# Patient Record
Sex: Female | Born: 1960 | Race: Black or African American | Hispanic: No | Smoking: Never smoker
Health system: Southern US, Community
[De-identification: ages and names within clinical notes are randomized; demographics above are authoritative.]

## PROBLEM LIST (undated history)

## (undated) DIAGNOSIS — E785 Hyperlipidemia, unspecified: Secondary | ICD-10-CM

## (undated) DIAGNOSIS — M549 Dorsalgia, unspecified: Secondary | ICD-10-CM

## (undated) DIAGNOSIS — N920 Excessive and frequent menstruation with regular cycle: Secondary | ICD-10-CM

## (undated) DIAGNOSIS — H409 Unspecified glaucoma: Secondary | ICD-10-CM

## (undated) DIAGNOSIS — Z789 Other specified health status: Secondary | ICD-10-CM

## (undated) DIAGNOSIS — E119 Type 2 diabetes mellitus without complications: Secondary | ICD-10-CM

## (undated) HISTORY — DX: Unspecified glaucoma: H40.9

## (undated) HISTORY — DX: Dorsalgia, unspecified: M54.9

## (undated) HISTORY — DX: Hyperlipidemia, unspecified: E78.5

## (undated) HISTORY — PX: MULTIPLE TOOTH EXTRACTIONS: SHX2053

---

## 2006-05-12 ENCOUNTER — Emergency Department (HOSPITAL_COMMUNITY): Admission: EM | Admit: 2006-05-12 | Discharge: 2006-05-12 | Payer: Self-pay | Admitting: Emergency Medicine

## 2006-08-09 ENCOUNTER — Emergency Department (HOSPITAL_COMMUNITY): Admission: EM | Admit: 2006-08-09 | Discharge: 2006-08-09 | Payer: Self-pay | Admitting: Family Medicine

## 2007-09-13 ENCOUNTER — Emergency Department (HOSPITAL_COMMUNITY): Admission: EM | Admit: 2007-09-13 | Discharge: 2007-09-13 | Payer: Self-pay | Admitting: Emergency Medicine

## 2008-02-16 ENCOUNTER — Emergency Department (HOSPITAL_COMMUNITY): Admission: EM | Admit: 2008-02-16 | Discharge: 2008-02-16 | Payer: Self-pay | Admitting: Emergency Medicine

## 2008-02-24 ENCOUNTER — Emergency Department (HOSPITAL_COMMUNITY): Admission: EM | Admit: 2008-02-24 | Discharge: 2008-02-24 | Payer: Self-pay | Admitting: Emergency Medicine

## 2008-02-29 ENCOUNTER — Emergency Department (HOSPITAL_COMMUNITY): Admission: EM | Admit: 2008-02-29 | Discharge: 2008-02-29 | Payer: Self-pay | Admitting: Emergency Medicine

## 2008-03-09 ENCOUNTER — Emergency Department (HOSPITAL_COMMUNITY): Admission: EM | Admit: 2008-03-09 | Discharge: 2008-03-09 | Payer: Self-pay | Admitting: Emergency Medicine

## 2009-10-23 ENCOUNTER — Emergency Department (HOSPITAL_COMMUNITY): Admission: EM | Admit: 2009-10-23 | Discharge: 2009-10-23 | Payer: Self-pay | Admitting: Emergency Medicine

## 2011-04-01 ENCOUNTER — Encounter: Payer: Self-pay | Admitting: Cardiology

## 2011-04-02 ENCOUNTER — Ambulatory Visit: Payer: Self-pay | Admitting: Cardiology

## 2011-07-27 LAB — POCT I-STAT, CHEM 8
BUN: 10
Calcium, Ion: 1.13
Chloride: 105
Creatinine, Ser: 0.8
Glucose, Bld: 99
HCT: 38
Hemoglobin: 12.9
Potassium: 3.9
Sodium: 137
TCO2: 21

## 2011-07-27 LAB — POCT CARDIAC MARKERS
CKMB, poc: 2.2
Myoglobin, poc: 84.5
Operator id: 257131
Troponin i, poc: <0.05

## 2011-07-27 LAB — D-DIMER, QUANTITATIVE: D-Dimer, Quant: 0.26

## 2011-07-27 LAB — CK TOTAL AND CKMB (NOT AT ARMC)
CK, MB: 3.3
Relative Index: 2
Total CK: 162

## 2011-07-27 LAB — TROPONIN I: Troponin I: 0.02

## 2011-08-10 LAB — OCCULT BLOOD X 1 CARD TO LAB, STOOL: Fecal Occult Bld: NEGATIVE

## 2011-12-17 ENCOUNTER — Encounter (HOSPITAL_COMMUNITY): Payer: Self-pay | Admitting: Obstetrics and Gynecology

## 2011-12-17 ENCOUNTER — Inpatient Hospital Stay (HOSPITAL_COMMUNITY)
Admission: AD | Admit: 2011-12-17 | Discharge: 2011-12-17 | Disposition: A | Discharge status: Home / Self Care | Payer: BC Managed Care – PPO | Source: Ambulatory Visit | Attending: Obstetrics & Gynecology | Admitting: Obstetrics & Gynecology

## 2011-12-17 DIAGNOSIS — N920 Excessive and frequent menstruation with regular cycle: Secondary | ICD-10-CM | POA: Diagnosis present

## 2011-12-17 HISTORY — DX: Other specified health status: Z78.9

## 2011-12-17 HISTORY — DX: Excessive and frequent menstruation with regular cycle: N92.0

## 2011-12-17 LAB — CBC
HCT: 34.1 % — ABNORMAL LOW (ref 36.0–46.0)
Hemoglobin: 10.7 g/dL — ABNORMAL LOW (ref 12.0–15.0)
MCH: 28.3 pg (ref 26.0–34.0)
MCHC: 31.4 g/dL (ref 30.0–36.0)
MCV: 90.2 fL (ref 78.0–100.0)
Platelets: 332 10*3/uL (ref 150–400)
RBC: 3.78 MIL/uL — ABNORMAL LOW (ref 3.87–5.11)
RDW: 12.7 % (ref 11.5–15.5)
WBC: 7.8 10*3/uL (ref 4.0–10.5)

## 2011-12-17 LAB — WET PREP, GENITAL
Clue Cells Wet Prep HPF POC: NONE SEEN
Trich, Wet Prep: NONE SEEN
Yeast Wet Prep HPF POC: NONE SEEN

## 2011-12-17 MED ORDER — MEGESTROL ACETATE 40 MG PO TABS: 40.0000 mg | ORAL_TABLET | Freq: Every day | ORAL | Status: AC

## 2011-12-17 NOTE — Progress Notes (Signed)
Pt also reports some dizziness and a headache that has been off and on all day.

## 2011-12-17 NOTE — ED Provider Notes (Signed)
History     Chief Complaint  Patient presents with  . Vaginal Bleeding   HPI  Pt is 51 years old and complains of heavy vaginal bleeding for 2 weeks.  Past Medical History  Diagnosis Date  . Back pain     Past Surgical History  Procedure Date  . Cesarean section     No family history on file.  History  Substance Use Topics  . Smoking status: Never Smoker   . Smokeless tobacco: Not on file  . Alcohol Use: No    Allergies: No Known Allergies  Prescriptions prior to admission  Medication Sig Dispense Refill  . HYDROcodone-acetaminophen (VICODIN) 5-500 MG per tablet Take 1 tablet by mouth every 6 (six) hours as needed.        . Ibuprofen (ADVIL PO) Take by mouth as needed.          ROS Physical Exam   Blood pressure 117/80, pulse 72, temperature 98.4 F (36.9 C), temperature source Oral, resp. rate 18, height 5\' 6"  (1.676 m), weight 173 lb 8 oz (78.699 kg), last menstrual period 12/05/2011.  Physical Exam  MAU Course  Procedures Care turned over to [facility], CNM Assessment and Plan    Roberta Jenkins 12/17/2011, 7:21 PM

## 2011-12-17 NOTE — Progress Notes (Signed)
Pt states, " I have had bleeding for two weeks, and I have been passing quarter size clots. I had cramping yesterday but none today."

## 2011-12-17 NOTE — Progress Notes (Signed)
"  I have been bleeding and passing clots for 2 weeks.  I've bled like this one time 2 yrs ago.  No cramping, but I did have some cramping yesterday."

## 2011-12-17 NOTE — ED Provider Notes (Signed)
Chief Complaint:  Vaginal Bleeding   Roberta Jenkins is  51 y.o. [phone].  Patient's last menstrual period was 12/05/2011..  She presents complaining of Vaginal Bleeding  Reports LMP 12/05/2011 as expected, however lasted x 2 weeks. States heavy bleeding requiring changing a pad q 2 hours with quarter-sized clots. Lighter today but still bleeding. Denies abd pain or dizziness Obstetrical/Gynecological History: OB History    Grav Para Term Preterm Abortions TAB SAB Ect Mult Living   4 3 3  1  1   3       Past Medical History: Past Medical History  Diagnosis Date  . Back pain   . No pertinent past medical history   . Menorrhagia 12/17/2011    Past Surgical History: Past Surgical History  Procedure Date  . Cesarean section     Family History: Family History  Problem Relation Age of Onset  . Hypertension Mother   . Cancer Mother     Social History: History  Substance Use Topics  . Smoking status: Never Smoker   . Smokeless tobacco: Not on file  . Alcohol Use: No    Allergies: No Known Allergies  Prescriptions prior to admission  Medication Sig Dispense Refill  . calcium carbonate (TUMS - DOSED IN MG ELEMENTAL CALCIUM) 500 MG chewable tablet Chew 1 tablet by mouth daily.      . multivitamin (THERAGRAN) per tablet Take 1 tablet by mouth daily.        Review of Systems - Negative except what has been reviewed in the HPI  Physical Exam   Blood pressure 117/80, pulse 72, temperature 98.4 F (36.9 C), temperature source Oral, resp. rate 18, height 5\' 6"  (1.676 m), weight 173 lb 8 oz (78.699 kg), last menstrual period 12/05/2011.  General: General appearance - alert, well appearing, and in no distress, oriented to person, place, and time and overweight Mental status - alert, oriented to person, place, and time, normal mood, behavior, speech, dress, motor activity, and thought processes, affect appropriate to mood Abdomen - soft, nontender, nondistended, no masses or  organomegaly Focused Gynecological Exam: VULVA: normal appearing vulva with no masses, tenderness or lesions, VAGINA: vaginal discharge - moderate menstrual-like bleeding noted in vault, no clots, CERVIX: normal appearing cervix without discharge or lesions, UTERUS: irregular, mobile, non tender, ADNEXA: normal adnexa in size, nontender and no masses  Labs: Recent Results (from the past 24 hour(s))  CBC   Collection Time   12/17/11  7:25 PM      Component Value Range   WBC 7.8  4.0 - 10.5 (K/uL)   RBC 3.78 (*) 3.87 - 5.11 (MIL/uL)   Hemoglobin 10.7 (*) 12.0 - 15.0 (g/dL)   HCT 45.4 (*) 09.8 - 46.0 (%)   MCV 90.2  78.0 - 100.0 (fL)   MCH 28.3  26.0 - 34.0 (pg)   MCHC 31.4  30.0 - 36.0 (g/dL)   RDW 11.9  14.7 - 82.9 (%)   Platelets 332  150 - 400 (K/uL)  WET PREP, GENITAL   Collection Time   12/17/11  9:50 PM      Component Value Range   Yeast Wet Prep HPF POC NONE SEEN  NONE SEEN    Trich, Wet Prep NONE SEEN  NONE SEEN    Clue Cells Wet Prep HPF POC NONE SEEN  NONE SEEN    WBC, Wet Prep HPF POC RARE (*) NONE SEEN      Assessment: Menorrhagia   Plan: Discharge home Rx Megace and  Iron Follow up next week for outpatient pelvic US, clinic staff will call to schedule clinic appt date/time  Codi Kertz E. 12/17/2011,10:18 PM

## 2011-12-17 NOTE — Discharge Instructions (Signed)
Abnormal Uterine Bleeding Abnormal uterine bleeding can have many causes. Some cases are simply treated, while others are more serious. There are several kinds of bleeding that is considered abnormal, including:  Bleeding between periods.   Bleeding after sexual intercourse.   Spotting anytime in the menstrual cycle.   Bleeding heavier or more than normal.   Bleeding after menopause.  CAUSES  There are many causes of abnormal uterine bleeding. It can be present in teenagers, pregnant women, women during their reproductive years, and women who have reached menopause. Your caregiver will look for the more common causes depending on your age, signs, symptoms and your particular circumstance. Most cases are not serious and can be treated. Even the more serious causes, like cancer of the female organs, can be treated adequately if found in the early stages. That is why all types of bleeding should be evaluated and treated as soon as possible. DIAGNOSIS  Diagnosing the cause may take several kinds of tests. Your caregiver may:  Take a complete history of the type of bleeding.   Perform a complete physical exam and Pap smear.   Take an ultrasound on the abdomen showing a picture of the female organs and the pelvis.   Inject dye into the uterus and Fallopian tubes and X-ray them (hysterosalpingogram).   Place fluid in the uterus and do an ultrasound (sonohysterogrqphy).   Take a CT scan to examine the female organs and pelvis.   Take an MRI to examine the female organs and pelvis. There is no X-ray involved with this procedure.   Look inside the uterus with a telescope that has a light at the end (hysteroscopy).   Scrap the inside of the uterus to get tissue to examine (Dilatation and Curettage, D&C).   Look into the pelvis with a telescope that has a light at the end (laparoscopy). This is done through a very small cut (incision) in the abdomen.  TREATMENT  Treatment will depend on the  cause of the abnormal bleeding. It can include:  Doing nothing to allow the problem to take care of itself over time.   Hormone treatment.   Birth control pills.   Treating the medical condition causing the problem.   Laparoscopy.   Major or minor surgery   Destroying the lining of the uterus with electrical currant, laser, freezing or heat (uterine ablation).  HOME CARE INSTRUCTIONS   Follow your caregiver's recommendation on how to treat your problem.   See your caregiver if you missed a menstrual period and think you may be pregnant.   If you are bleeding heavily, count the number of pads/tampons you use and how often you have to change them. Tell this to your caregiver.   Avoid sexual intercourse until the problem is controlled.  SEEK MEDICAL CARE IF:   You have any kind of abnormal bleeding mentioned above.   You feel dizzy at times.   You are 51 years old and have not had a menstrual period yet.  SEEK IMMEDIATE MEDICAL CARE IF:   You pass out.  You are changinMenorrhagia  Menorrhagia is when a menstrual period is heavier or longer than normal. HOME CARE Only take medicine as told by your doctor.  Do not take aspirin 1 week before or during your period. Aspirin can make the bleeding worse.  Lay down for a while if you change your tampon or pad more than once in 2 hours. This may help lessen the bleeding.  Take any iron pills  as told by your doctor. Heavy bleeding may cause you to lack iron in your body.  Eat a healthy diet and foods with iron. These foods include leafy green vegetables, meat, liver, eggs, and whole grain breads and cereals.  Eat foods that are high in vitamin C. These include oranges, orange juice, and grapefruits. Vitamin C can help your body take in more iron.  Do not try to lose weight. Wait until the heavy bleeding has stopped and your iron level is normal.  GET HELP RIGHT AWAY IF: You get a fever.  You have trouble breathing.  You bleed even  more heavily than usual and pass blood clots.  You feel dizzy, weak, or pass out (faint).  You need to change your tampon or pad more than once an hour.  You feel sick to your stomach (nauseous), throw up (vomit), or have watery poop (diarrhea).  You have problems from medicine.  MAKE SURE YOU:  Understand these instructions.  Will watch your condition.  Will get help right away if you are not doing well or get worse.  Document Released: 07/27/2008 Document Revised: 06/30/2011 Document Reviewed: 07/27/2008  [facility] Patient Information 2012 [place], [place].g pads/tampons every 15 to 30 minutes.   You have belly (abdominal) pain.   You have a temperature of 100 F (37.8 C) or higher.   You become sweaty or weak.   You are passing large blood clots from the vagina.   You start to feel sick to your stomach (nauseous) and throw up (vomit).  Document Released: 10/18/2005 Document Revised: 06/30/2011 Document Reviewed: 03/13/2009 [facility] Patient Information 2012 [place], [place].

## 2011-12-18 LAB — GC/CHLAMYDIA PROBE AMP, GENITAL
Chlamydia, DNA Probe: NEGATIVE
GC Probe Amp, Genital: NEGATIVE

## 2011-12-20 ENCOUNTER — Other Ambulatory Visit (HOSPITAL_COMMUNITY): Payer: BC Managed Care – PPO

## 2011-12-20 ENCOUNTER — Ambulatory Visit (HOSPITAL_COMMUNITY)
Admission: RE | Admit: 2011-12-20 | Discharge: 2011-12-20 | Disposition: A | Discharge status: Home / Self Care | Payer: BC Managed Care – PPO | Source: Ambulatory Visit | Attending: Family Medicine | Admitting: Family Medicine

## 2011-12-20 ENCOUNTER — Inpatient Hospital Stay (HOSPITAL_COMMUNITY): Admit: 2011-12-20 | Payer: BC Managed Care – PPO

## 2011-12-20 DIAGNOSIS — N950 Postmenopausal bleeding: Secondary | ICD-10-CM | POA: Insufficient documentation

## 2011-12-20 DIAGNOSIS — N920 Excessive and frequent menstruation with regular cycle: Secondary | ICD-10-CM

## 2011-12-20 DIAGNOSIS — D251 Intramural leiomyoma of uterus: Secondary | ICD-10-CM | POA: Insufficient documentation

## 2011-12-27 NOTE — ED Provider Notes (Signed)
Medical Screening exam and patient care preformed by advanced practice provider.  Agree with the above management.  

## 2011-12-29 ENCOUNTER — Encounter: Payer: Self-pay | Admitting: Obstetrics and Gynecology

## 2011-12-29 ENCOUNTER — Ambulatory Visit (INDEPENDENT_AMBULATORY_CARE_PROVIDER_SITE_OTHER): Payer: BC Managed Care – PPO | Admitting: Obstetrics and Gynecology

## 2011-12-29 VITALS — BP 131/75 | HR 76 | Temp 97.6°F | Resp 20 | Ht 66.0 in | Wt 172.3 lb

## 2011-12-29 DIAGNOSIS — N939 Abnormal uterine and vaginal bleeding, unspecified: Secondary | ICD-10-CM

## 2011-12-29 DIAGNOSIS — N949 Unspecified condition associated with female genital organs and menstrual cycle: Secondary | ICD-10-CM

## 2011-12-29 DIAGNOSIS — N925 Other specified irregular menstruation: Secondary | ICD-10-CM

## 2011-12-29 DIAGNOSIS — Z124 Encounter for screening for malignant neoplasm of cervix: Secondary | ICD-10-CM

## 2011-12-29 DIAGNOSIS — Z01812 Encounter for preprocedural laboratory examination: Secondary | ICD-10-CM

## 2011-12-29 DIAGNOSIS — N938 Other specified abnormal uterine and vaginal bleeding: Secondary | ICD-10-CM

## 2011-12-29 DIAGNOSIS — N926 Irregular menstruation, unspecified: Secondary | ICD-10-CM

## 2011-12-29 LAB — POCT PREGNANCY, URINE: Preg Test, Ur: NEGATIVE

## 2011-12-29 MED ORDER — MISOPROSTOL 200 MCG PO TABS: ORAL_TABLET | ORAL | Status: DC

## 2011-12-29 NOTE — Patient Instructions (Signed)
You need a biopsy to make sure no cancer is causing abnormal bleeding. Will try again after cytotec treatment. Place the tablet into your vagina on the night prior to biopsy (appointment will be made). You may take 1000mg  of tylenol (acteaminophen) prior to this biopsy for pain.   Endometrial Biopsy This is a test in which a tissue sample (a biopsy) is taken from inside the uterus (womb). It is then looked at by a specialist under a microscope to see if the tissue is normal or abnormal. The endometrium is the lining of the uterus. This test helps determine where you are in your menstrual cycle and how hormone levels are affecting the lining of the uterus. Another use for this test is to diagnose endometrial cancer, tuberculosis, polyps, or inflammatory conditions and to evaluate uterine bleeding. PREPARATION FOR TEST No preparation or fasting is necessary. NORMAL FINDINGS No pathologic conditions. Presence of "secretory-type" endometrium 3 to 5 days before to normal menstruation. Ranges for normal findings may vary among different laboratories and hospitals. You should always check with your doctor after having lab work or other tests done to discuss the meaning of your test results and whether your values are considered within normal limits. MEANING OF TEST  Your caregiver will go over the test results with you and discuss the importance and meaning of your results, as well as treatment options and the need for additional tests if necessary. OBTAINING THE TEST RESULTS It is your responsibility to obtain your test results. Ask the lab or department performing the test when and how you will get your results. Document Released: 02/18/2005 Document Revised: 06/30/2011 Document Reviewed: 09/27/2008 [facility] Patient Information 2012 [place], [place].

## 2011-12-29 NOTE — Progress Notes (Signed)
  Subjective:    Patient ID: Roberta Jenkins, female    DOB: 1961-08-13, 51 y.o.   MRN: [id number]  HPI  1. Menorrhagia. Patient has new heavy bleeding and cramping, passing clots. Onset was 3 weeks ago. Seen in MAU and given rx for megace with stopped bleeding after one week. [place] at that time showed stripe 9mm, small subserosal fibroid and 6x5x5 cm intramural fibroid. Patient has never experienced extended time without menstrual bleeding, does not believe she is post-menopausal. She has had irregular cycle length since she was a teen.   Maternal hx of colon cancer. Nonsmoker.   2. Anemia. Hgb was 10.7, taking iron daily now. Denies syncope.  Review of Systems See HPI otherwise negative.     Objective:   Physical Exam  Vitals reviewed. Constitutional: She is oriented to person, place, and time. She appears well-developed and well-nourished. No distress.  HENT:  Head: Normocephalic and atraumatic.  Abdominal: Soft. She exhibits no distension. There is no tenderness. There is no rebound and no guarding.  Genitourinary: Vagina normal.       External os unable to be penetrated with sound/dilator. No tissue abnormalities.   Neurological: She is alert and oriented to person, place, and time.      Assessment & Plan:   Endometrial Biopsy Procedure Note  Pre-operative Diagnosis: menorrhagia/dysfunctional uterine bleeding  Post-operative Diagnosis: same  Indications: abnormal uterine bleeding, thickened endometrial stipe  Procedure Details   Urine pregnancy test was done and result was neg.  The risks (including infection, bleeding, pain, and uterine perforation) and benefits of the procedure were explained to the patient and Written informed consent was obtained.  Antibiotic prophylaxis against endocarditis was not indicated.   The patient was placed in the dorsal lithotomy position.  Bimanual exam showed the uterus to be in the neutral position.  A Graves' speculum inserted in the  vagina, and the cervix prepped with povidone iodine.  Endocervical curettage with a Kevorkian curette was not performed due to inability to pass dilator through external os.  Condition: Stable  Complications: None  Plan:  Patient will follow up for new attempt after cytotec cervical softening night prior.   The patient was advised to call for any fever or for prolonged or severe pain or bleeding. She was advised to use OTC acetaminophen as needed for mild to moderate pain. She was advised to avoid vaginal intercourse for 48 hours or until the bleeding has completely stopped.  Attending Physician Documentation: Leola Brazil was present and participated.

## 2011-12-29 NOTE — Progress Notes (Signed)
UPT - Negative

## 2012-01-20 ENCOUNTER — Other Ambulatory Visit: Payer: BC Managed Care – PPO | Admitting: Advanced Practice Midwife

## 2012-02-01 ENCOUNTER — Inpatient Hospital Stay (HOSPITAL_COMMUNITY)
Admission: AD | Admit: 2012-02-01 | Discharge: 2012-02-02 | Disposition: A | Discharge status: Home / Self Care | Payer: BC Managed Care – PPO | Attending: Family Medicine | Admitting: Family Medicine

## 2012-02-01 DIAGNOSIS — N938 Other specified abnormal uterine and vaginal bleeding: Secondary | ICD-10-CM | POA: Insufficient documentation

## 2012-02-01 DIAGNOSIS — D219 Benign neoplasm of connective and other soft tissue, unspecified: Secondary | ICD-10-CM

## 2012-02-01 DIAGNOSIS — N949 Unspecified condition associated with female genital organs and menstrual cycle: Secondary | ICD-10-CM | POA: Insufficient documentation

## 2012-02-01 DIAGNOSIS — N926 Irregular menstruation, unspecified: Secondary | ICD-10-CM

## 2012-02-01 DIAGNOSIS — D259 Leiomyoma of uterus, unspecified: Secondary | ICD-10-CM | POA: Insufficient documentation

## 2012-02-01 DIAGNOSIS — N939 Abnormal uterine and vaginal bleeding, unspecified: Secondary | ICD-10-CM

## 2012-02-01 DIAGNOSIS — N925 Other specified irregular menstruation: Secondary | ICD-10-CM | POA: Insufficient documentation

## 2012-02-02 ENCOUNTER — Encounter (HOSPITAL_COMMUNITY): Payer: Self-pay | Admitting: *Deleted

## 2012-02-02 LAB — CBC
HCT: 35.9 % — ABNORMAL LOW (ref 36.0–46.0)
Hemoglobin: 11.5 g/dL — ABNORMAL LOW (ref 12.0–15.0)
MCH: 28.8 pg (ref 26.0–34.0)
MCHC: 32 g/dL (ref 30.0–36.0)
MCV: 90 fL (ref 78.0–100.0)
Platelets: 311 10*3/uL (ref 150–400)
RBC: 3.99 MIL/uL (ref 3.87–5.11)
RDW: 12.4 % (ref 11.5–15.5)
WBC: 6.2 10*3/uL (ref 4.0–10.5)

## 2012-02-02 MED ORDER — KETOROLAC TROMETHAMINE 60 MG/2ML IM SOLN
60.0000 mg | Freq: Once | INTRAMUSCULAR | Status: AC
  Administered 2012-02-02: 60 mg via INTRAMUSCULAR
  Filled 2012-02-02: qty 2

## 2012-02-02 MED ORDER — MISOPROSTOL 200 MCG PO TABS: ORAL_TABLET | ORAL | Status: DC

## 2012-02-02 MED ORDER — IBUPROFEN 800 MG PO TABS: 800.0000 mg | ORAL_TABLET | Freq: Three times a day (TID) | ORAL | Status: AC | PRN

## 2012-02-02 NOTE — MAU Provider Note (Signed)
Chart reviewed and agree with management and plan.  

## 2012-02-02 NOTE — MAU Note (Signed)
Pt reports she reports she has been bleeding x 12 days and has been having left side pain tonight. Denies fever, nausea, vomiting. Pt states she was seen here 1 month ago for abnormal bleeding and was given meds and an appointment was scheduled for this upcoming Friday but she did not feel like she could wait. Pt reports she has used 1-2 pads today.

## 2012-02-02 NOTE — MAU Provider Note (Signed)
History     CSN: [id number]  Arrival date and time: 02/01/12 2357   None     Chief Complaint  Patient presents with  . Abdominal Pain   HPI 51 y.o. Roberta Jenkins with vaginal bleeding and left sided pain brought in by EMS. Bleeding x 12 days, using 1-2 pads /day at this time. Pain occurs intermittently with bleeding. Not taking anything for pain. Seen in MAU in February with vaginal bleeding, noted to have endometrial thickness of 9 mm and a 6.5 cm left sided fibroid. Followed up in GYN clinic for EMB, but was not performed d/t stenotic cervical os. Has appointment on Friday of this week to try EMB again with cytotec. States she did not feel she could wait for that appointment.    Past Medical History  Diagnosis Date  . Back pain   . No pertinent past medical history   . Menorrhagia 12/17/2011    Past Surgical History  Procedure Date  . Cesarean section 1981, 1989  . Multiple tooth extractions     Family History  Problem Relation Age of Onset  . Hypertension Mother   . Cancer Mother     colon  . Cancer Father   . Kidney disease Sister   . Hypertension Brother     History  Substance Use Topics  . Smoking status: Never Smoker   . Smokeless tobacco: Not on file  . Alcohol Use: No    Allergies: No Known Allergies  Prescriptions prior to admission  Medication Sig Dispense Refill  . calcium carbonate (TUMS - DOSED IN MG ELEMENTAL CALCIUM) 500 MG chewable tablet Chew 1 tablet by mouth daily.      . misoprostol (CYTOTEC) 200 MCG tablet Take one tablet in vagina night prior to endometrial biopsy.  1 tablet  0  . multivitamin (THERAGRAN) per tablet Take 1 tablet by mouth daily.        Review of Systems  Constitutional: Negative.   Respiratory: Negative.   Cardiovascular: Negative.   Gastrointestinal: Positive for abdominal pain. Negative for nausea, vomiting, diarrhea and constipation.  Genitourinary: Negative for dysuria, urgency, frequency, hematuria and flank pain.     Positive for vaginal bleeding   Musculoskeletal: Negative.   Neurological: Negative.   Psychiatric/Behavioral: Negative.    Physical Exam   Blood pressure 113/77, pulse 68, temperature 98.3 F (36.8 C), resp. rate 18, height 5\' 8"  (1.727 m), weight 180 lb (81.647 kg), last menstrual period 01/23/2012.  Physical Exam  Nursing note and vitals reviewed. Constitutional: She is oriented to person, place, and time. She appears well-developed and well-nourished. No distress.  Cardiovascular: Normal rate.   Respiratory: Effort normal.  GI: Soft. She exhibits no distension and no mass. There is no tenderness. There is no rebound and no guarding.  Genitourinary: There is no tenderness, lesion or injury on the right labia. There is no tenderness, lesion or injury on the left labia. Uterus is enlarged (large fibroid palpable on left ). Uterus is not tender. Cervix exhibits no motion tenderness and no discharge. Right adnexum displays no tenderness and no fullness. Left adnexum displays no tenderness and no fullness. There is bleeding (small) around the vagina.  Musculoskeletal: Normal range of motion.  Neurological: She is alert and oriented to person, place, and time.  Skin: Skin is warm and dry.  Psychiatric: She has a normal mood and affect.    MAU Course  Procedures Results for orders placed during the hospital encounter of 02/01/12 (from the past 24  hour(s))  CBC     Status: Abnormal   Collection Time   02/02/12 12:08 AM      Component Value Range   WBC 6.2  4.0 - 10.5 (K/uL)   RBC 3.99  3.87 - 5.11 (MIL/uL)   Hemoglobin 11.5 (*) 12.0 - 15.0 (g/dL)   HCT 16.1 (*) 09.6 - 46.0 (%)   MCV 90.0  78.0 - 100.0 (fL)   MCH 28.8  26.0 - 34.0 (pg)   MCHC 32.0  30.0 - 36.0 (g/dL)   RDW 04.5  40.9 - 81.1 (%)   Platelets 311  150 - 400 (K/uL)   Hgb 10.7 at last visit in February.   UPT negative.   Pain improved with Toradol 60 mg IM.   Assessment and Plan  51 y.o. Roberta Jenkins with  DUB/Fibroids Rx Motrin 600 mg q6h for pain F/U at scheduled GYN appt on Friday for EMB. Pt appears unaware of her existing cytotec rx that she is supposed to take prior to that appt. Reiterated instructions and sent new rx for Cytotec to her pharmacy. Pt states understanding.  Rev'd precautions  Roberta Jenkins 02/02/2012, 12:21 AM

## 2012-02-04 ENCOUNTER — Other Ambulatory Visit (HOSPITAL_COMMUNITY)
Admission: RE | Admit: 2012-02-04 | Discharge: 2012-02-04 | Disposition: A | Discharge status: Home / Self Care | Payer: BC Managed Care – PPO | Source: Ambulatory Visit | Attending: Obstetrics & Gynecology | Admitting: Obstetrics & Gynecology

## 2012-02-04 ENCOUNTER — Encounter: Payer: Self-pay | Admitting: Obstetrics & Gynecology

## 2012-02-04 ENCOUNTER — Ambulatory Visit (INDEPENDENT_AMBULATORY_CARE_PROVIDER_SITE_OTHER): Payer: BC Managed Care – PPO | Admitting: Obstetrics & Gynecology

## 2012-02-04 VITALS — BP 116/72 | HR 77 | Temp 98.9°F | Ht 66.0 in | Wt 177.4 lb

## 2012-02-04 DIAGNOSIS — Z01812 Encounter for preprocedural laboratory examination: Secondary | ICD-10-CM

## 2012-02-04 DIAGNOSIS — N949 Unspecified condition associated with female genital organs and menstrual cycle: Secondary | ICD-10-CM | POA: Insufficient documentation

## 2012-02-04 DIAGNOSIS — N925 Other specified irregular menstruation: Secondary | ICD-10-CM | POA: Insufficient documentation

## 2012-02-04 DIAGNOSIS — N882 Stricture and stenosis of cervix uteri: Secondary | ICD-10-CM

## 2012-02-04 DIAGNOSIS — N920 Excessive and frequent menstruation with regular cycle: Secondary | ICD-10-CM

## 2012-02-04 DIAGNOSIS — N938 Other specified abnormal uterine and vaginal bleeding: Secondary | ICD-10-CM | POA: Insufficient documentation

## 2012-02-04 LAB — POCT PREGNANCY, URINE: Preg Test, Ur: NEGATIVE

## 2012-02-04 NOTE — Progress Notes (Signed)
  Endometrial Biopsy Procedure Note  Pre-operative Diagnosis: Menorrhagia, failed prior attempt of endometrial biopsy 2/2 cervical stenosis (patient placed cytotec per vagina yesterday)  Post-operative Diagnosis: same  Procedure Details  Urine pregnancy test was done today and result was negative.  The risks (including infection, bleeding, pain, and uterine perforation) and benefits of the procedure were explained to the patient and Written informed consent was obtained. The patient was placed in the dorsal lithotomy position.  Bimanual exam showed the uterus to be in the neutral position.  A Graves' speculum inserted in the vagina, and the cervix prepped with povidone iodine.  A sharp tenaculum was applied to the anterior lip of the cervix for stabilization.  A sterile uterine sound and dilator was used to sound the uterus to a depth of 9cm.  A Mylex 3mm curette was used to sample the endometrium.  Sample was sent for pathologic examination.  Condition: Stable  Complications: None  Plan:  The patient was advised to call for any fever or for prolonged or severe pain or bleeding. She was advised to use OTC acetaminophen and OTC analgesics as needed for mild to moderate pain. She was advised to avoid vaginal intercourse for 48 hours or until the bleeding has completely stopped.  Attending Physician Documentation: I was present for or participated in the entire procedure, including opening and closing.

## 2012-02-04 NOTE — Progress Notes (Signed)
Patient had attempted endometrial biopsy at last visit for dub, patient states took cytotec in preparation for biopsy today.

## 2012-02-04 NOTE — Patient Instructions (Signed)

## 2012-03-10 ENCOUNTER — Ambulatory Visit (INDEPENDENT_AMBULATORY_CARE_PROVIDER_SITE_OTHER): Payer: BC Managed Care – PPO | Admitting: Obstetrics & Gynecology

## 2012-03-10 VITALS — BP 126/76 | HR 75 | Temp 97.4°F | Ht 68.0 in | Wt 180.0 lb

## 2012-03-10 DIAGNOSIS — N920 Excessive and frequent menstruation with regular cycle: Secondary | ICD-10-CM

## 2012-03-10 MED ORDER — MEGESTROL ACETATE 40 MG PO TABS: 40.0000 mg | ORAL_TABLET | Freq: Every day | ORAL | Status: DC

## 2012-03-10 MED ORDER — MEGESTROL ACETATE 40 MG PO TABS: 40.0000 mg | ORAL_TABLET | Freq: Every day | ORAL | Status: AC

## 2012-03-10 NOTE — Progress Notes (Signed)
Patient ID: Roberta Jenkins, female   DOB: [birth year], 51 y.o.   MRN: [id number] Subjective:     Roberta Jenkins is an 52 y.o. woman who presents for follow-up of  irregular menses and endometrial biopsy results.  Since her last office visit she has a period 5/1-5/9, moderate bleeding, few clots, no cramping.   Menstrual History: OB History    Grav Para Term Preterm Abortions TAB SAB Ect Mult Living   4 3 3  1  1   3       Patient's last menstrual period was 03/01/2012.    Review of Systems Pertinent items are noted in HPI.    Objective:    BP 126/76  Pulse 75  Temp(Src) 97.4 F (36.3 C) (Oral)  Ht 5\' 8"  (1.727 m)  Wt 180 lb (81.647 kg)  BMI 27.37 kg/m2  LMP 03/01/2012  General:   alert, cooperative and no distress     Assessment:  Menorrhagia Improving. Endometrium, biopsy abundant benign endocervical mucosa associated chronic inflammation, with no evidence of dysplasia or malignancy. We discussed treatment options for menorrhagia (IUD, OCPs, uterine ablation, continue Megace, watch and wait).  Patient opted to continue megace prn.  Plan:    Continue megace as needed.

## 2012-03-10 NOTE — Assessment & Plan Note (Signed)
A: improving menorrhagia. Endometrial biopsy negative for malignancy. P: Continue megace as needed.

## 2012-03-10 NOTE — Patient Instructions (Signed)
[place],  Thank you very much for coming in today. As we discussed your biopsy results was negative for cancerous cells.   For your bleeding: please continue megace as needed for heavy bleeding.   If the bleeding worsens, keep in mind that there are other options (Mirena IUD, Depo Shot, daily birth control pills and uterine ablation).   Please f/u as needed.   Dr. Armen Pickup

## 2012-12-09 ENCOUNTER — Encounter (HOSPITAL_COMMUNITY): Payer: Self-pay | Admitting: Emergency Medicine

## 2012-12-09 ENCOUNTER — Emergency Department (HOSPITAL_COMMUNITY)
Admission: EM | Admit: 2012-12-09 | Discharge: 2012-12-09 | Disposition: A | Discharge status: Home / Self Care | Payer: BC Managed Care – PPO | Attending: Emergency Medicine | Admitting: Emergency Medicine

## 2012-12-09 DIAGNOSIS — Z8739 Personal history of other diseases of the musculoskeletal system and connective tissue: Secondary | ICD-10-CM | POA: Insufficient documentation

## 2012-12-09 DIAGNOSIS — R062 Wheezing: Secondary | ICD-10-CM | POA: Insufficient documentation

## 2012-12-09 DIAGNOSIS — J3489 Other specified disorders of nose and nasal sinuses: Secondary | ICD-10-CM | POA: Insufficient documentation

## 2012-12-09 DIAGNOSIS — R059 Cough, unspecified: Secondary | ICD-10-CM

## 2012-12-09 DIAGNOSIS — Z8742 Personal history of other diseases of the female genital tract: Secondary | ICD-10-CM | POA: Insufficient documentation

## 2012-12-09 DIAGNOSIS — R05 Cough: Secondary | ICD-10-CM | POA: Insufficient documentation

## 2012-12-09 MED ORDER — AEROCHAMBER Z-STAT PLUS/MEDIUM MISC: 1.0000 | Freq: Once | Status: DC

## 2012-12-09 MED ORDER — HYDROCOD POLST-CHLORPHEN POLST 10-8 MG/5ML PO LQCR: 5.0000 mL | Freq: Two times a day (BID) | ORAL | Status: DC | PRN

## 2012-12-09 MED ORDER — ALBUTEROL SULFATE (5 MG/ML) 0.5% IN NEBU: 5.0000 mg | INHALATION_SOLUTION | Freq: Once | RESPIRATORY_TRACT | Status: DC

## 2012-12-09 NOTE — ED Provider Notes (Signed)
History     CSN: [id number]  Arrival date & time 12/09/12  1118   First MD Initiated Contact with Patient 12/09/12 1151      Chief Complaint  Patient presents with  . Cough  . Nasal Congestion    (Consider location/radiation/quality/duration/timing/severity/associated sxs/prior treatment) HPI Comments: Patient presents with complaint of cough and mild rhinorrhea for the past 7 days. Patient initially had additional symptoms such as sore throat which have improved. She denies fever, nausea, or vomiting. She denies ear pain. She's been using over-the-counter cold medications without relief. She denies having history of asthma or bronchitis. She states that the coughing is worse at night and is associated with wheezing. Onset of symptoms gradual. Course is constant. Nothing makes symptoms better or worse.  The history is provided by the patient.    Past Medical History  Diagnosis Date  . Back pain   . No pertinent past medical history   . Menorrhagia 12/17/2011    Past Surgical History  Procedure Laterality Date  . Cesarean section  1981, 1989  . Multiple tooth extractions      Family History  Problem Relation Age of Onset  . Hypertension Mother   . Cancer Mother     colon  . Cancer Father   . Kidney disease Sister   . Hypertension Brother     History  Substance Use Topics  . Smoking status: Never Smoker   . Smokeless tobacco: Never Used  . Alcohol Use: No    OB History   Grav Para Term Preterm Abortions TAB SAB Ect Mult Living   4 3 3  1  1   3       Review of Systems  Constitutional: Negative for fever, chills and fatigue.  HENT: Positive for rhinorrhea. Negative for ear pain, congestion, sore throat, neck stiffness and sinus pressure.   Eyes: Negative for redness.  Respiratory: Positive for cough and wheezing. Negative for shortness of breath.   Gastrointestinal: Negative for nausea, vomiting, abdominal pain and diarrhea.  Genitourinary: Negative for  dysuria.  Musculoskeletal: Negative for myalgias.  Skin: Negative for rash.  Neurological: Negative for headaches.  Hematological: Negative for adenopathy.  Psychiatric/Behavioral: Positive for sleep disturbance.    Allergies  Review of patient's allergies indicates no known allergies.  Home Medications   Current Outpatient Rx  Name  Route  Sig  Dispense  Refill  . aspirin-sod bicarb-citric acid (ALKA-SELTZER) 325 MG TBEF   Oral   Take 650 mg by mouth every 6 (six) hours as needed (cough).         . Multiple Vitamin (MULTIVITAMIN WITH MINERALS) TABS   Oral   Take 1 tablet by mouth daily.         Marland Kitchen OVER THE COUNTER MEDICATION   Oral   Take 3 tablets by mouth daily. Vitamin for skin, hair, and nails         . chlorpheniramine-HYDROcodone (TUSSIONEX PENNKINETIC ER) 10-8 MG/5ML LQCR   Oral   Take 5 mLs by mouth every 12 (twelve) hours as needed.   115 mL   0     BP 118/84  Pulse 71  Temp(Src) 97.1 F (36.2 C) (Oral)  Ht 5\' 8"  (1.727 m)  Wt 180 lb (81.647 kg)  BMI 27.38 kg/m2  SpO2 100%  Physical Exam  Nursing note and vitals reviewed. Constitutional: She appears well-developed and well-nourished.  HENT:  Head: Normocephalic and atraumatic. No trismus in the jaw.  Right Ear: Tympanic membrane, external ear  and ear canal normal.  Left Ear: Tympanic membrane, external ear and ear canal normal.  Nose: Mucosal edema and rhinorrhea present.  Mouth/Throat: Uvula is midline, oropharynx is clear and moist and mucous membranes are normal. Mucous membranes are not dry. No oral lesions. No edematous. No oropharyngeal exudate, posterior oropharyngeal edema, posterior oropharyngeal erythema or tonsillar abscesses.  Eyes: Conjunctivae are normal. Right eye exhibits no discharge. Left eye exhibits no discharge.  Neck: Normal range of motion. Neck supple.  Cardiovascular: Normal rate, regular rhythm and normal heart sounds.   Pulmonary/Chest: Effort normal and breath sounds  normal. No respiratory distress. She has no wheezes. She has no rales.  Abdominal: Soft. There is no tenderness.  Lymphadenopathy:    She has no cervical adenopathy.  Neurological: She is alert.  Skin: Skin is warm and dry.  Psychiatric: She has a normal mood and affect.    ED Course  Procedures (including critical care time)  Labs Reviewed - No data to display No results found.   1. Cough    12:35 PM Patient seen and examined. Medications ordered.   Vital signs reviewed and are as follows: Filed Vitals:   12/09/12 1125  BP: 118/84  Pulse: 71  Temp: 97.1 F (36.2 C)   Patient counseled on use of albuterol HFA.  Told to use 1-2 puffs q 4 hours as needed for SOB.  Patient counseled on use of narcotic cough medications. Urged not to drink alcohol, drive, or perform any other activities that requires focus while taking these medications. The patient verbalizes understanding and agrees with the plan.     MDM  Patient with likely post-URI cough/bronchitis. No concern for PNA given normal lung exam. Antibiotics not indicated. Conservative therapy indicated.           Renne Crigler, [place] 12/09/12 1311

## 2012-12-09 NOTE — ED Provider Notes (Signed)
Medical screening examination/treatment/procedure(s) were performed by non-physician practitioner and as supervising physician I was immediately available for consultation/collaboration.  Doug Sou, MD 12/09/12 [phone]

## 2012-12-09 NOTE — ED Notes (Signed)
Pt c/o cough and nasal congestion that began 12/02/12.  Pt reports the symptoms are worse.  Denies fever or chills.

## 2014-09-02 ENCOUNTER — Encounter (HOSPITAL_COMMUNITY): Payer: Self-pay | Admitting: Emergency Medicine

## 2014-09-07 ENCOUNTER — Inpatient Hospital Stay (HOSPITAL_COMMUNITY)
Admission: AD | Admit: 2014-09-07 | Discharge: 2014-09-07 | Disposition: A | Discharge status: Home / Self Care | Payer: BC Managed Care – PPO | Source: Ambulatory Visit | Attending: Obstetrics & Gynecology | Admitting: Obstetrics & Gynecology

## 2014-09-07 ENCOUNTER — Encounter (HOSPITAL_COMMUNITY): Payer: Self-pay | Admitting: *Deleted

## 2014-09-07 DIAGNOSIS — N920 Excessive and frequent menstruation with regular cycle: Secondary | ICD-10-CM | POA: Diagnosis not present

## 2014-09-07 LAB — URINE MICROSCOPIC-ADD ON

## 2014-09-07 LAB — CBC
HCT: 32.1 % — ABNORMAL LOW (ref 36.0–46.0)
Hemoglobin: 10.1 g/dL — ABNORMAL LOW (ref 12.0–15.0)
MCH: 27.6 pg (ref 26.0–34.0)
MCHC: 31.5 g/dL (ref 30.0–36.0)
MCV: 87.7 fL (ref 78.0–100.0)
Platelets: 382 10*3/uL (ref 150–400)
RBC: 3.66 MIL/uL — ABNORMAL LOW (ref 3.87–5.11)
RDW: 14.2 % (ref 11.5–15.5)
WBC: 6.2 10*3/uL (ref 4.0–10.5)

## 2014-09-07 LAB — URINALYSIS, ROUTINE W REFLEX MICROSCOPIC
Bilirubin Urine: NEGATIVE
Glucose, UA: NEGATIVE mg/dL
Ketones, ur: NEGATIVE mg/dL
Leukocytes, UA: NEGATIVE
Nitrite: NEGATIVE
Protein, ur: NEGATIVE mg/dL
Specific Gravity, Urine: >1.03 — ABNORMAL HIGH (ref 1.005–1.030)
Urobilinogen, UA: 0.2 mg/dL (ref 0.0–1.0)
pH: 5.5 (ref 5.0–8.0)

## 2014-09-07 LAB — POCT PREGNANCY, URINE: Preg Test, Ur: NEGATIVE

## 2014-09-07 MED ORDER — KETOROLAC TROMETHAMINE 60 MG/2ML IM SOLN
60.0000 mg | Freq: Once | INTRAMUSCULAR | Status: AC
  Administered 2014-09-07: 60 mg via INTRAMUSCULAR
  Filled 2014-09-07: qty 2

## 2014-09-07 MED ORDER — MEGESTROL ACETATE 40 MG PO TABS
40.0000 mg | ORAL_TABLET | Freq: Every day | ORAL | Status: DC
  Administered 2014-09-07: 40 mg via ORAL
  Filled 2014-09-07: qty 1

## 2014-09-07 MED ORDER — MEGESTROL ACETATE 40 MG PO TABS: 40.0000 mg | ORAL_TABLET | Freq: Every day | ORAL | Status: DC

## 2014-09-07 NOTE — MAU Provider Note (Signed)
History     CSN: [id number]  Arrival date and time: 09/07/14 0251   None     Chief Complaint  Patient presents with  . Vaginal Bleeding   HPI   [name] is a 53 y.o. female [phone] whop presents with vaginal bleeding times 3 weeks; the patient has a history of chronic menorrhagia. She is changing around 3 pads a day. Her bleeding it currently very light; it lightened up around 0200. She had a normal menstrual cycle 2 weeks ago, however she continued to having spotting that has not stopped.   OB History    Gravida Para Term Preterm AB TAB SAB Ectopic Multiple Living   4 3 3  1  1   3       Past Medical History  Diagnosis Date  . Back pain   . No pertinent past medical history   . Menorrhagia 12/17/2011    Past Surgical History  Procedure Laterality Date  . Cesarean section  1981, 1989  . Multiple tooth extractions      Family History  Problem Relation Age of Onset  . Hypertension Mother   . Cancer Mother     colon  . Cancer Father   . Kidney disease Sister   . Hypertension Brother     History  Substance Use Topics  . Smoking status: Never Smoker   . Smokeless tobacco: Never Used  . Alcohol Use: No    Allergies: No Known Allergies  Prescriptions prior to admission  Medication Sig Dispense Refill Last Dose  . aspirin-sod bicarb-citric acid (ALKA-SELTZER) 325 MG TBEF Take 650 mg by mouth every 6 (six) hours as needed (cough).   12/08/2012 at Unknown  . chlorpheniramine-HYDROcodone (TUSSIONEX PENNKINETIC ER) 10-8 MG/5ML LQCR Take 5 mLs by mouth every 12 (twelve) hours as needed. 115 mL 0   . Multiple Vitamin (MULTIVITAMIN WITH MINERALS) TABS Take 1 tablet by mouth daily.   12/08/2012 at Unknown  . OVER THE COUNTER MEDICATION Take 3 tablets by mouth daily. Vitamin for skin, hair, and nails   12/08/2012 at Unknown   Results for orders placed or performed during the hospital encounter of 09/07/14 (from the past 72 hour(s))  Urinalysis, Routine w reflex  microscopic     Status: Abnormal   Collection Time: 09/07/14  3:20 AM  Result Value Ref Range   Color, Urine YELLOW YELLOW   APPearance CLEAR CLEAR   Specific Gravity, Urine >1.030 (H) 1.005 - 1.030   pH 5.5 5.0 - 8.0   Glucose, UA NEGATIVE NEGATIVE mg/dL   Hgb urine dipstick SMALL (A) NEGATIVE   Bilirubin Urine NEGATIVE NEGATIVE   Ketones, ur NEGATIVE NEGATIVE mg/dL   Protein, ur NEGATIVE NEGATIVE mg/dL   Urobilinogen, UA 0.2 0.0 - 1.0 mg/dL   Nitrite NEGATIVE NEGATIVE   Leukocytes, UA NEGATIVE NEGATIVE  Urine microscopic-add on     Status: Abnormal   Collection Time: 09/07/14  3:20 AM  Result Value Ref Range   Squamous Epithelial / LPF RARE RARE   WBC, UA 0-2 <3 WBC/hpf   RBC / HPF 0-2 <3 RBC/hpf   Bacteria, UA RARE RARE   Crystals CA OXALATE CRYSTALS (A) NEGATIVE  Pregnancy, urine POC     Status: None   Collection Time: 09/07/14  3:32 AM  Result Value Ref Range   Preg Test, Ur NEGATIVE NEGATIVE    Comment:        THE SENSITIVITY OF THIS METHODOLOGY IS >24 mIU/mL   CBC  Status: Abnormal   Collection Time: 09/07/14  3:38 AM  Result Value Ref Range   WBC 6.2 4.0 - 10.5 K/uL   RBC 3.66 (L) 3.87 - 5.11 MIL/uL   Hemoglobin 10.1 (L) 12.0 - 15.0 g/dL   HCT 32.1 (L) 36.0 - 46.0 %   MCV 87.7 78.0 - 100.0 fL   MCH 27.6 26.0 - 34.0 pg   MCHC 31.5 30.0 - 36.0 g/dL   RDW 14.2 11.5 - 15.5 %   Platelets 382 150 - 400 K/uL    Review of Systems  Constitutional: Negative for fever and chills.  Gastrointestinal: Positive for abdominal pain.   Physical Exam   Blood pressure 123/77, pulse 60, temperature 98.3 F (36.8 C), resp. rate 18, height 5\' 8"  (1.727 m), weight 77.928 kg (171 lb 12.8 oz), last menstrual period 08/18/2014, SpO2 100 %.  Physical Exam  Constitutional: She is oriented to person, place, and time. She appears well-developed and well-nourished. No distress.  HENT:  Head: Normocephalic.  Eyes: Pupils are equal, round, and reactive to light.  Cardiovascular:  Normal rate.   Respiratory: Effort normal.  GI: Soft. She exhibits no distension. There is no tenderness.  Genitourinary:  Speculum exam: Vagina - Small amount of creamy, brown discharge Cervix - No contact bleeding, no active bleeding  Bimanual exam: Cervix closed Uterus non tender, normal size Adnexa non tender, no masses bilaterally Chaperone present for exam.   Musculoskeletal: Normal range of motion.  Neurological: She is alert and oriented to person, place, and time.  Skin: Skin is warm. She is not diaphoretic.  Psychiatric: Her behavior is normal.    MAU Course  Procedures  None  MDM Megace Toradol  Pt states her pain has much improved following Toradol   Assessment and Plan   A: Menorrhagia; chronic  P: Discharge home in stable condition RX: megace Out patient ultrasound this week; staff will call you to schedule the appointment.  Follow up in the clinic following pelvic US;message sent to the clinic Bleeding precautions    Darrelyn Hillock Hadas Jessop, NP 09/09/2014 8:40 AM

## 2014-09-07 NOTE — Discharge Instructions (Signed)
Abnormal Uterine Bleeding Abnormal uterine bleeding can affect women at various stages in life, including teenagers, women in their reproductive years, pregnant women, and women who have reached menopause. Several kinds of uterine bleeding are considered abnormal, including:  Bleeding or spotting between periods.   Bleeding after sexual intercourse.   Bleeding that is heavier or more than normal.   Periods that last longer than usual.  Bleeding after menopause.  Many cases of abnormal uterine bleeding are minor and simple to treat, while others are more serious. Any type of abnormal bleeding should be evaluated by your health care provider. Treatment will depend on the cause of the bleeding. HOME CARE INSTRUCTIONS Monitor your condition for any changes. The following actions may help to alleviate any discomfort you are experiencing:  Avoid the use of tampons and douches as directed by your health care provider.  Change your pads frequently. You should get regular pelvic exams and Pap tests. Keep all follow-up appointments for diagnostic tests as directed by your health care provider.  SEEK MEDICAL CARE IF:   Your bleeding lasts more than 1 week.   You feel dizzy at times.  SEEK IMMEDIATE MEDICAL CARE IF:   You pass out.   You are changing pads every 15 to 30 minutes.   You have abdominal pain.  You have a fever.   You become sweaty or weak.   You are passing large blood clots from the vagina.   You start to feel nauseous and vomit. MAKE SURE YOU:   Understand these instructions.  Will watch your condition.  Will get help right away if you are not doing well or get worse. Document Released: 10/18/2005 Document Revised: 10/23/2013 Document Reviewed: 05/17/2013 [employer] Patient Information 2015 [employer]. This information is not intended to replace advice given to you by your health care provider. Make sure you discuss any questions you have with your  health care provider.  

## 2014-09-07 NOTE — Progress Notes (Signed)
Written and verbal d/c instructions given and understanding voiced. 

## 2014-09-07 NOTE — MAU Note (Signed)
I have been having a period for 3 wks. Uses about 3 regular size pads daily. Abd cramping and pain in vaginal area

## 2014-09-09 ENCOUNTER — Other Ambulatory Visit (HOSPITAL_COMMUNITY): Payer: Self-pay | Admitting: Obstetrics and Gynecology

## 2014-09-09 DIAGNOSIS — N920 Excessive and frequent menstruation with regular cycle: Secondary | ICD-10-CM

## 2014-09-10 ENCOUNTER — Ambulatory Visit (HOSPITAL_COMMUNITY)
Admission: RE | Admit: 2014-09-10 | Discharge: 2014-09-10 | Disposition: A | Discharge status: Home / Self Care | Payer: BC Managed Care – PPO | Source: Ambulatory Visit | Attending: Obstetrics and Gynecology | Admitting: Obstetrics and Gynecology

## 2014-09-10 DIAGNOSIS — N920 Excessive and frequent menstruation with regular cycle: Secondary | ICD-10-CM

## 2014-09-10 DIAGNOSIS — D259 Leiomyoma of uterus, unspecified: Secondary | ICD-10-CM | POA: Insufficient documentation

## 2014-09-23 ENCOUNTER — Telehealth (HOSPITAL_COMMUNITY): Payer: Self-pay | Admitting: Obstetrics and Gynecology

## 2014-09-23 NOTE — Telephone Encounter (Signed)
Left message with patient to have her call MAU.

## 2014-10-09 ENCOUNTER — Inpatient Hospital Stay (HOSPITAL_COMMUNITY)
Admission: AD | Admit: 2014-10-09 | Discharge: 2014-10-09 | Disposition: A | Discharge status: Home / Self Care | Payer: BC Managed Care – PPO | Source: Ambulatory Visit | Attending: Obstetrics & Gynecology | Admitting: Obstetrics & Gynecology

## 2014-10-09 DIAGNOSIS — R109 Unspecified abdominal pain: Secondary | ICD-10-CM | POA: Diagnosis not present

## 2014-10-09 DIAGNOSIS — D259 Leiomyoma of uterus, unspecified: Secondary | ICD-10-CM | POA: Insufficient documentation

## 2014-10-09 DIAGNOSIS — N939 Abnormal uterine and vaginal bleeding, unspecified: Secondary | ICD-10-CM | POA: Diagnosis not present

## 2014-10-09 LAB — CBC
HCT: 31.9 % — ABNORMAL LOW (ref 36.0–46.0)
Hemoglobin: 10.3 g/dL — ABNORMAL LOW (ref 12.0–15.0)
MCH: 28.2 pg (ref 26.0–34.0)
MCHC: 32.3 g/dL (ref 30.0–36.0)
MCV: 87.4 fL (ref 78.0–100.0)
Platelets: 352 10*3/uL (ref 150–400)
RBC: 3.65 MIL/uL — ABNORMAL LOW (ref 3.87–5.11)
RDW: 14.3 % (ref 11.5–15.5)
WBC: 6.6 10*3/uL (ref 4.0–10.5)

## 2014-10-09 MED ORDER — MEGESTROL ACETATE 40 MG PO TABS: ORAL_TABLET | ORAL | Status: DC

## 2014-10-09 NOTE — MAU Note (Signed)
Vaginal bleeding for the last 2 months, became heavier today, passed some clots 2 days ago.  Lower abd cramping & vaginal pain.  Has appt in clinic 12/23 but states she cannot wait.

## 2014-10-09 NOTE — MAU Provider Note (Signed)
History     CSN: [id number]  Arrival date and time: 10/09/14 1807   None     Chief Complaint  Patient presents with  . Vaginal Bleeding   HPI  Roberta Jenkins is a 53 y.o. [phone] who presents to MAU today with complaint of vaginal bleeding x 2 months. Patient was seen in MAU 09/07/14 for the same issue and given Megace. She states that she took the Megace and bleeding stopped. Once the bleeding had stopped she discontinued the Megace without consulting anyone. She states she is currently using 3 pads per day on average. She noted a few small clots a few days ago, but none since then. She endorses very mild occasional lower abdominal cramping today. She states that she has been tired and weak. She denies dizziness.   OB History    Gravida Para Term Preterm AB TAB SAB Ectopic Multiple Living   4 3 3  1  1   3       Past Medical History  Diagnosis Date  . Back pain   . No pertinent past medical history   . Menorrhagia 12/17/2011    Past Surgical History  Procedure Laterality Date  . Cesarean section  1981, 1989  . Multiple tooth extractions      Family History  Problem Relation Age of Onset  . Hypertension Mother   . Cancer Mother     colon  . Cancer Father   . Kidney disease Sister   . Hypertension Brother     History  Substance Use Topics  . Smoking status: Never Smoker   . Smokeless tobacco: Never Used  . Alcohol Use: No    Allergies: No Known Allergies  No prescriptions prior to admission    Review of Systems  Constitutional: Positive for malaise/fatigue. Negative for fever.  Gastrointestinal: Positive for abdominal pain. Negative for nausea, vomiting and diarrhea.  Genitourinary:       + vaginal bleeding  Neurological: Positive for weakness. Negative for dizziness and loss of consciousness.   Physical Exam   Blood pressure 118/74, pulse 74, temperature 98.7 F (37.1 C), temperature source Oral, resp. rate 18, last menstrual period  09/16/2014.  Physical Exam  Constitutional: She appears well-developed and well-nourished. No distress.  HENT:  Head: Normocephalic.  Cardiovascular: Normal rate.   Respiratory: Effort normal.  GI: Soft. She exhibits no distension and no mass. There is no tenderness. There is no rebound and no guarding.  Genitourinary:  Deferred since performed at previous visit and patient will be seen in [place] next week  Neurological: She is alert.  Skin: Skin is warm and dry. No erythema.  Psychiatric: She has a normal mood and affect.   Results for orders placed or performed during the hospital encounter of 10/09/14 (from the past 24 hour(s))  CBC     Status: Abnormal   Collection Time: 10/09/14  7:19 PM  Result Value Ref Range   WBC 6.6 4.0 - 10.5 K/uL   RBC 3.65 (L) 3.87 - 5.11 MIL/uL   Hemoglobin 10.3 (L) 12.0 - 15.0 g/dL   HCT 31.9 (L) 36.0 - 46.0 %   MCV 87.4 78.0 - 100.0 fL   MCH 28.2 26.0 - 34.0 pg   MCHC 32.3 30.0 - 36.0 g/dL   RDW 14.3 11.5 - 15.5 %   Platelets 352 150 - 400 K/uL     MAU Course  Procedures None  MDM Hgb stable from last visit Pain is minimal and patient  is stable [place] from previous visit shows dominant 8.1 cm fibroid  Assessment and Plan  A: Abnormal uterine bleeding Uterine fibroid  P: Discharge home Rx for Megace given to patient. Patient advised to continue at least until [place] follow-up Bleeding precautions discussed Work note given. Patient requests restrictions for heavy lifting stating it worsens her bleeding [facility] follow-up appointment for 10/14/14 @ 1:00 pm given to patient Patient may return to MAU as needed or if her condition were to change or worsen   Luvenia Redden, PA-C  10/09/2014, 11:25 PM

## 2014-10-09 NOTE — Discharge Instructions (Signed)
Endometrial Biopsy Endometrial biopsy is a procedure in which a tissue sample is taken from inside the uterus. The tissue sample is then looked at under a microscope to see if the tissue is normal or abnormal. The endometrium is the lining of the uterus. This procedure helps determine where you are in your menstrual cycle and how hormone levels are affecting the lining of the uterus. This procedure may also be used to evaluate uterine bleeding or to diagnose endometrial cancer, tuberculosis, polyps, or inflammatory conditions.  LET [facility] CARE PROVIDER KNOW ABOUT:  Any allergies you have.  All medicines you are taking, including vitamins, herbs, eye drops, creams, and over-the-counter medicines.  Previous problems you or members of your family have had with the use of anesthetics.  Any blood disorders you have.  Previous surgeries you have had.  Medical conditions you have.  Possibility of pregnancy. RISKS AND COMPLICATIONS Generally, this is a safe procedure. However, as with any procedure, complications can occur. Possible complications include:  Bleeding.  Pelvic infection.  Puncture of the uterine wall with the biopsy device (rare). BEFORE THE PROCEDURE   Keep a record of your menstrual cycles as directed by your health care provider. You may need to schedule your procedure for a specific time in your cycle.  You may want to bring a sanitary pad to wear home after the procedure.  Arrange for someone to drive you home after the procedure if you will be given a medicine to help you relax (sedative). PROCEDURE   You may be given a sedative to relax you.  You will lie on an exam table with your feet and legs supported as in a pelvic exam.  Your health care provider will insert an instrument (speculum) into your vagina to see your cervix.  Your cervix will be cleansed with an antiseptic solution. A medicine (local anesthetic) will be used to numb the cervix.  A forceps  instrument (tenaculum) will be used to hold your cervix steady for the biopsy.  A thin, rodlike instrument (uterine sound) will be inserted through your cervix to determine the length of your uterus and the location where the biopsy sample will be removed.  A thin, flexible tube (catheter) will be inserted through your cervix and into the uterus. The catheter is used to collect the biopsy sample from your endometrial tissue.  The catheter and speculum will then be removed, and the tissue sample will be sent to a lab for examination. AFTER THE PROCEDURE  You will rest in a recovery area until you are ready to go home.  You may have mild cramping and a small amount of vaginal bleeding for a few days after the procedure. This is normal.  Make sure you find out how to get your test results. Document Released: 02/18/2005 Document Revised: 06/20/2013 Document Reviewed: 04/04/2013 [facility] Patient Information 2015 [place], [place]. This information is not intended to replace advice given to you by your health care provider. Make sure you discuss any questions you have with your health care provider.  Abnormal Uterine Bleeding Abnormal uterine bleeding can affect women at various stages in life, including teenagers, women in their reproductive years, pregnant women, and women who have reached menopause. Several kinds of uterine bleeding are considered abnormal, including:  Bleeding or spotting between periods.   Bleeding after sexual intercourse.   Bleeding that is heavier or more than normal.   Periods that last longer than usual.  Bleeding after menopause.  Many cases of abnormal  uterine bleeding are minor and simple to treat, while others are more serious. Any type of abnormal bleeding should be evaluated by your health care provider. Treatment will depend on the cause of the bleeding. HOME CARE INSTRUCTIONS Monitor your condition for any changes. The following actions may help to  alleviate any discomfort you are experiencing:  Avoid the use of tampons and douches as directed by your health care provider.  Change your pads frequently. You should get regular pelvic exams and Pap tests. Keep all follow-up appointments for diagnostic tests as directed by your health care provider.  SEEK MEDICAL CARE IF:   Your bleeding lasts more than 1 week.   You feel dizzy at times.  SEEK IMMEDIATE MEDICAL CARE IF:   You pass out.   You are changing pads every 15 to 30 minutes.   You have abdominal pain.  You have a fever.   You become sweaty or weak.   You are passing large blood clots from the vagina.   You start to feel nauseous and vomit. MAKE SURE YOU:   Understand these instructions.  Will watch your condition.  Will get help right away if you are not doing well or get worse. Document Released: 10/18/2005 Document Revised: 10/23/2013 Document Reviewed: 05/17/2013 [facility] Patient Information 2015 [place], [place]. This information is not intended to replace advice given to you by your health care provider. Make sure you discuss any questions you have with your health care provider.

## 2014-10-14 ENCOUNTER — Ambulatory Visit (INDEPENDENT_AMBULATORY_CARE_PROVIDER_SITE_OTHER): Payer: BC Managed Care – PPO | Admitting: Obstetrics & Gynecology

## 2014-10-14 ENCOUNTER — Other Ambulatory Visit (HOSPITAL_COMMUNITY)
Admission: RE | Admit: 2014-10-14 | Discharge: 2014-10-14 | Disposition: A | Discharge status: Home / Self Care | Payer: BC Managed Care – PPO | Source: Ambulatory Visit | Attending: Obstetrics & Gynecology | Admitting: Obstetrics & Gynecology

## 2014-10-14 ENCOUNTER — Encounter: Payer: Self-pay | Admitting: Obstetrics & Gynecology

## 2014-10-14 VITALS — BP 121/70 | HR 67 | Ht 68.0 in | Wt 176.4 lb

## 2014-10-14 DIAGNOSIS — D259 Leiomyoma of uterus, unspecified: Secondary | ICD-10-CM | POA: Diagnosis not present

## 2014-10-14 DIAGNOSIS — N938 Other specified abnormal uterine and vaginal bleeding: Secondary | ICD-10-CM

## 2014-10-14 MED ORDER — MEGESTROL ACETATE 40 MG PO TABS: 40.0000 mg | ORAL_TABLET | Freq: Two times a day (BID) | ORAL | Status: DC

## 2014-10-14 NOTE — Progress Notes (Signed)
Subjective:     Patient ID: Roberta Jenkins, female   DOB: 1961/10/03, 53 y.o.   MRN: [id number]  HPI Pt reports that 2 years ago she had bleeding problems which resolved.  She reports that her bleeding pattern went back to normal.  She reports that this time she had been bleeding for 2 weeks months prior to going to the MAU.  She reports that the Megace has improved her bleeding a lot.  She reports that she occ has pain with her bleeding. She denies f/c/n/v.     Review of Systems     Objective:   Physical Exam BP 121/70 mmHg  Pulse 67  Ht 5\' 8"  (1.727 m)  Wt 176 lb 6.4 oz (80.015 kg)  BMI 26.83 kg/m2  LMP 09/16/2014 (Approximate)  The indications for endometrial biopsy were reviewed.   Risks of the biopsy including cramping, bleeding, infection, uterine perforation, inadequate specimen and need for additional procedures  were discussed. The patient states she understands and agrees to undergo procedure today. Consent was signed. Time out was performed. Urine HCG was negative. A sterile speculum was placed in the patient's vagina and the cervix was prepped with Betadine. A single-toothed tenaculum was placed on the anterior lip of the cervix to stabilize it. The 3 mm pipelle was introduced into the endometrial cavity without difficulty to a depth of 10cm, and a moderate amount of tissue was obtained and sent to pathology. The instruments were removed from the patient's vagina. Minimal bleeding from the cervix was noted. The patient tolerated the procedure well.     02/04/2012 Diagnosis Endometrium, biopsy - SCANTY SUPERFICIAL FRAGMENTS OF WEAKLY PROLIFERATIVE ENDOMETRIUM. PLEASE SEE COMMENT. - ABUNDANT BENIGN ENDOCERVICAL MUCOSA WITH ASSOCIATED CHRONIC INFLAMMATION, WITH NO EVIDENCE OF DYSPLASIA OR MALIGNANCY.  12/20/2011 TRANSABDOMINAL AND TRANSVAGINAL ULTRASOUND OF PELVIS  Technique: Both transabdominal and transvaginal ultrasound examinations of the pelvis were performed.  Transabdominal technique was performed for global imaging of the pelvis including uterus, ovaries, adnexal regions, and pelvic cul-de-sac.  It was necessary to proceed with endovaginal exam following the transabdominal exam to visualize the endometrium and ovaries.  Comparison: None.  Findings: Uterus: 10.3 x 6.3 x 10.2 cm. An intramural fibroid is seen in the left uterine body which measures 6.5 x 5.0 x 5.4 cm. A tiny subserosal fibroid is seen in the right posterior fundus which measures 1.3 cm in maximum diameter. Numerous tiny cervical Nabothian cysts incidentally noted.  Endometrium: Incompletely visualized due to acoustic shadowing from above of fibroids. A small amount of echogenic fluid or blood seen in the endometrial cavity.Double layer endometrial thickness measures 9 mm where visualized.  Right ovary: 3.7 x 1.6 x 2.7 cm. Normal appearance.  Left ovary: 3.5 x 1.8 x 2.8 cm. Normal appearance.  Other Findings: No free fluid  IMPRESSION:  1. Uterine fibroids, with dominant fibroid measuring 6.5 cm. 2. Abnormal post menopausal endometrial thickness measuring 9 mm, with small amount of echogenic fluid or blood also noted within endometrial cavity. In the setting of post-menopausal bleeding, endometrial sampling is indicated. 3. Normal ovaries. No evidence of adnexal mass or free fluid.  09/2014 COMPARISON: 12/20/2011  FINDINGS: Uterus  Measurements: 12.3 x 7.9 x 10.8 cm. 8.1 x 7.5 x 7.9 cm transmural fibroid in the uterine fundus.  Endometrium  Not discretely visualized/displaced by the dominant fibroid.  Right ovary  Measurements: 3.6 x 2.2 x 3.0 cm. Normal appearance/no adnexal mass.  Left ovary  Measurements: 3.5 x 1.5 x 3.0 cm. Normal appearance/no adnexal  mass.  Other findings  No free fluid.  IMPRESSION: Dominant 8.1 cm transmural fundal fibroid. Assessment:     Menorrhagia thought due to fibroids.  Sx  improved on Megace      Plan:     Megace 40mg  bid F/u in 3 months or sooner prn F/u results of Endometrial bx   Routine post-procedure instructions were given to the patient. The patient will follow up to review the results and for further management.

## 2014-10-15 ENCOUNTER — Telehealth: Payer: Self-pay

## 2014-10-15 NOTE — Telephone Encounter (Signed)
Called pt and left message to please return call to the clinics.

## 2014-10-15 NOTE — Telephone Encounter (Signed)
-----   Message from Lavonia Drafts, MD sent at 10/15/2014  3:12 PM EST ----- Please call pt.  Her endobx was negative.  She should continue the Megace 40mg  bid and f/u in 3 months or sooner prn.  Thx, clh-S

## 2014-10-17 ENCOUNTER — Encounter: Payer: Self-pay | Admitting: *Deleted

## 2014-10-17 NOTE — Telephone Encounter (Signed)
Called Roberta Jenkins and spoke to a female , he said she is not available right now. I asked if there was another number I could call her at and he said no.  Asked him to tell her we are calling with some information, not urgent - but please call the clinic. Will send letter.

## 2014-10-18 ENCOUNTER — Telehealth: Payer: Self-pay | Admitting: General Practice

## 2014-10-18 ENCOUNTER — Encounter: Payer: Self-pay | Admitting: *Deleted

## 2014-10-18 NOTE — Telephone Encounter (Signed)
Patient called and left message stating she received a missed call from [place] yesterday and is calling us back. Called patient back and a man answered stating she was indispose at the moment but would tell her we called.

## 2014-10-21 NOTE — Telephone Encounter (Signed)
Called patient, no answer- letter has already been sent out to patient

## 2014-10-23 ENCOUNTER — Encounter: Payer: BC Managed Care – PPO | Admitting: Obstetrics & Gynecology

## 2014-11-12 ENCOUNTER — Encounter: Payer: Self-pay | Admitting: General Practice

## 2015-01-16 ENCOUNTER — Ambulatory Visit: Payer: BC Managed Care – PPO | Admitting: Obstetrics & Gynecology

## 2015-07-16 ENCOUNTER — Encounter: Payer: Self-pay | Admitting: Obstetrics & Gynecology

## 2015-09-04 ENCOUNTER — Inpatient Hospital Stay (HOSPITAL_COMMUNITY)
Admission: AD | Admit: 2015-09-04 | Discharge: 2015-09-04 | Disposition: A | Discharge status: Home / Self Care | Payer: Self-pay | Source: Ambulatory Visit | Attending: Obstetrics and Gynecology | Admitting: Obstetrics and Gynecology

## 2015-09-04 ENCOUNTER — Encounter (HOSPITAL_COMMUNITY): Payer: Self-pay | Admitting: *Deleted

## 2015-09-04 DIAGNOSIS — Z7982 Long term (current) use of aspirin: Secondary | ICD-10-CM | POA: Insufficient documentation

## 2015-09-04 DIAGNOSIS — N938 Other specified abnormal uterine and vaginal bleeding: Secondary | ICD-10-CM

## 2015-09-04 DIAGNOSIS — D219 Benign neoplasm of connective and other soft tissue, unspecified: Secondary | ICD-10-CM

## 2015-09-04 DIAGNOSIS — N939 Abnormal uterine and vaginal bleeding, unspecified: Secondary | ICD-10-CM

## 2015-09-04 DIAGNOSIS — R102 Pelvic and perineal pain: Secondary | ICD-10-CM | POA: Insufficient documentation

## 2015-09-04 LAB — CBC
HCT: 33.8 % — ABNORMAL LOW (ref 36.0–46.0)
Hemoglobin: 10.8 g/dL — ABNORMAL LOW (ref 12.0–15.0)
MCH: 28.6 pg (ref 26.0–34.0)
MCHC: 32 g/dL (ref 30.0–36.0)
MCV: 89.4 fL (ref 78.0–100.0)
Platelets: 399 10*3/uL (ref 150–400)
RBC: 3.78 MIL/uL — ABNORMAL LOW (ref 3.87–5.11)
RDW: 13.1 % (ref 11.5–15.5)
WBC: 6.9 10*3/uL (ref 4.0–10.5)

## 2015-09-04 LAB — URINALYSIS, ROUTINE W REFLEX MICROSCOPIC
Bilirubin Urine: NEGATIVE
Glucose, UA: NEGATIVE mg/dL
Ketones, ur: NEGATIVE mg/dL
Leukocytes, UA: NEGATIVE
Nitrite: NEGATIVE
Protein, ur: NEGATIVE mg/dL
Specific Gravity, Urine: 1.02 (ref 1.005–1.030)
Urobilinogen, UA: 1 mg/dL (ref 0.0–1.0)
pH: 7.5 (ref 5.0–8.0)

## 2015-09-04 LAB — POCT PREGNANCY, URINE: Preg Test, Ur: NEGATIVE

## 2015-09-04 LAB — URINE MICROSCOPIC-ADD ON

## 2015-09-04 MED ORDER — MEGESTROL ACETATE 40 MG PO TABS: 40.0000 mg | ORAL_TABLET | Freq: Every day | ORAL | Status: DC

## 2015-09-04 NOTE — MAU Provider Note (Signed)
History   [id number]   Chief Complaint  Patient presents with  . Vaginal Bleeding    HPI Roberta Jenkins is a 54 y.o. female  [phone] here with report of vaginal bleeding that started on 08/25/15.  Bleeding is described as heavy last night.  Bleeding has decreased, +use of two pads today.  +pelvic pain yesterday.  Pain was rated a 10/10.  Denies pain today.  Last normal bleed in July 2016.  Reports irregular cycles for past year.    Patient's last menstrual period was 08/25/2015.  OB History  Gravida Para Term Preterm AB SAB TAB Ectopic Multiple Living  4 3 3  1 1    3     # Outcome Date GA Lbr Len/2nd Weight Sex Delivery Anes PTL Lv  4 Term           3 Term           2 Term           1 SAB               Past Medical History  Diagnosis Date  . Back pain   . No pertinent past medical history   . Menorrhagia 12/17/2011    Family History  Problem Relation Age of Onset  . Hypertension Mother   . Cancer Mother     colon  . Cancer Father   . Kidney disease Sister   . Hypertension Brother     Social History   Social History  . Marital Status: Divorced    Spouse Name: N/A  . Number of Children: N/A  . Years of Education: N/A   Social History Main Topics  . Smoking status: Never Smoker   . Smokeless tobacco: Never Used  . Alcohol Use: No  . Drug Use: No  . Sexual Activity: No     Comment: last intercourse was 5 yrs ago   Other Topics Concern  . None   Social History Narrative    No Known Allergies  No current facility-administered medications on file prior to encounter.   Current Outpatient Prescriptions on File Prior to Encounter  Medication Sig Dispense Refill  . aspirin-sod bicarb-citric acid (ALKA-SELTZER) 325 MG TBEF Take 650 mg by mouth every 6 (six) hours as needed (cough).    . chlorpheniramine-HYDROcodone (TUSSIONEX PENNKINETIC ER) 10-8 MG/5ML LQCR Take 5 mLs by mouth every 12 (twelve) hours as needed. (Patient not taking: Reported on  10/14/2014) 115 mL 0  . megestrol (MEGACE) 40 MG tablet Take 1 tablet (40 mg total) by mouth 2 (two) times daily. 1 po bid 60 tablet 3  . Multiple Vitamin (MULTIVITAMIN WITH MINERALS) TABS Take 1 tablet by mouth daily.    Marland Kitchen OVER THE COUNTER MEDICATION Take 3 tablets by mouth daily. Vitamin for skin, hair, and nails       Review of Systems  Gastrointestinal: Negative for abdominal pain.  Genitourinary: Positive for vaginal bleeding and menstrual problem. Negative for pelvic pain.  Neurological: Negative for dizziness.  All other systems reviewed and are negative.    Physical Exam   Filed Vitals:   09/04/15 1254  BP: 109/68  Pulse: 68  Temp: 97.9 F (36.6 C)  Resp: 18  Height: 5' 5.5" (1.664 m)  Weight: 186 lb 3.2 oz (84.46 kg)    Physical Exam  Constitutional: She is oriented to person, place, and time. She appears well-developed and well-nourished. No distress.  HENT:  Head: Normocephalic.  Neck: Normal range of motion.  Neck supple.  Cardiovascular: Normal rate, regular rhythm and normal heart sounds.   Respiratory: Effort normal and breath sounds normal.  GI: Soft. She exhibits no mass. There is tenderness. There is no guarding.  Genitourinary: Uterus is enlarged (5 month size) and tender. There is bleeding (scant, dark brown bleeding) in the vagina.  Neurological: She is alert and oriented to person, place, and time. She has normal reflexes.  Skin: Skin is warm and dry.    MAU Course  Procedures  Results for orders placed or performed during the hospital encounter of 09/04/15 (from the past 24 hour(s))  Urinalysis, Routine w reflex microscopic (not at [facility])     Status: Abnormal   Collection Time: 09/04/15 11:55 AM  Result Value Ref Range   Color, Urine YELLOW YELLOW   APPearance CLEAR CLEAR   Specific Gravity, Urine 1.020 1.005 - 1.030   pH 7.5 5.0 - 8.0   Glucose, UA NEGATIVE NEGATIVE mg/dL   Hgb urine dipstick LARGE (A) NEGATIVE   Bilirubin Urine NEGATIVE  NEGATIVE   Ketones, ur NEGATIVE NEGATIVE mg/dL   Protein, ur NEGATIVE NEGATIVE mg/dL   Urobilinogen, UA 1.0 0.0 - 1.0 mg/dL   Nitrite NEGATIVE NEGATIVE   Leukocytes, UA NEGATIVE NEGATIVE  Urine microscopic-add on     Status: Abnormal   Collection Time: 09/04/15 11:55 AM  Result Value Ref Range   Squamous Epithelial / LPF FEW (A) RARE   WBC, UA 0-2 <3 WBC/hpf   RBC / HPF 0-2 <3 RBC/hpf   Bacteria, UA FEW (A) RARE   Urine-Other AMORPHOUS URATES/PHOSPHATES   Pregnancy, urine POC     Status: None   Collection Time: 09/04/15  1:09 PM  Result Value Ref Range   Preg Test, Ur NEGATIVE NEGATIVE  CBC     Status: Abnormal   Collection Time: 09/04/15  1:15 PM  Result Value Ref Range   WBC 6.9 4.0 - 10.5 K/uL   RBC 3.78 (L) 3.87 - 5.11 MIL/uL   Hemoglobin 10.8 (L) 12.0 - 15.0 g/dL   HCT 33.8 (L) 36.0 - 46.0 %   MCV 89.4 78.0 - 100.0 fL   MCH 28.6 26.0 - 34.0 pg   MCHC 32.0 30.0 - 36.0 g/dL   RDW 13.1 11.5 - 15.5 %   Platelets 399 150 - 400 K/uL     Assessment and Plan  Abnormal Uterine Bleeding Hx of Fibroids  Plan: Discharge to home RX Megace 40 mg QD Outpatient pelvic ultrasound Follow-up in outpatient clinic   [place], CNM 09/04/2015 3:14 PM

## 2015-09-04 NOTE — Discharge Instructions (Signed)

## 2015-09-04 NOTE — MAU Note (Signed)
Having heavy bleeding, had this problem last year also.  Was given some meds, seemed to help.  Bleeding started 10/24. Has been having irregular cycles.

## 2015-09-12 ENCOUNTER — Ambulatory Visit (HOSPITAL_COMMUNITY): Admit: 2015-09-12 | Payer: BC Managed Care – PPO

## 2015-10-04 ENCOUNTER — Emergency Department (HOSPITAL_COMMUNITY)
Admission: EM | Admit: 2015-10-04 | Discharge: 2015-10-04 | Disposition: A | Discharge status: Home / Self Care | Payer: BC Managed Care – PPO | Attending: Emergency Medicine | Admitting: Emergency Medicine

## 2015-10-04 ENCOUNTER — Telehealth: Payer: Self-pay | Admitting: Obstetrics and Gynecology

## 2015-10-04 ENCOUNTER — Encounter (HOSPITAL_COMMUNITY): Payer: Self-pay | Admitting: Nurse Practitioner

## 2015-10-04 DIAGNOSIS — N938 Other specified abnormal uterine and vaginal bleeding: Secondary | ICD-10-CM | POA: Diagnosis present

## 2015-10-04 DIAGNOSIS — N921 Excessive and frequent menstruation with irregular cycle: Secondary | ICD-10-CM | POA: Diagnosis not present

## 2015-10-04 DIAGNOSIS — R519 Headache, unspecified: Secondary | ICD-10-CM

## 2015-10-04 DIAGNOSIS — Z79899 Other long term (current) drug therapy: Secondary | ICD-10-CM | POA: Insufficient documentation

## 2015-10-04 DIAGNOSIS — Z86018 Personal history of other benign neoplasm: Secondary | ICD-10-CM | POA: Diagnosis not present

## 2015-10-04 DIAGNOSIS — R51 Headache: Secondary | ICD-10-CM | POA: Insufficient documentation

## 2015-10-04 LAB — CBC
HCT: 34.8 % — ABNORMAL LOW (ref 36.0–46.0)
Hemoglobin: 11.3 g/dL — ABNORMAL LOW (ref 12.0–15.0)
MCH: 29.1 pg (ref 26.0–34.0)
MCHC: 32.5 g/dL (ref 30.0–36.0)
MCV: 89.7 fL (ref 78.0–100.0)
Platelets: 345 10*3/uL (ref 150–400)
RBC: 3.88 MIL/uL (ref 3.87–5.11)
RDW: 13.1 % (ref 11.5–15.5)
WBC: 6.6 10*3/uL (ref 4.0–10.5)

## 2015-10-04 LAB — WET PREP, GENITAL
Clue Cells Wet Prep HPF POC: NONE SEEN
Sperm: NONE SEEN
Trich, Wet Prep: NONE SEEN
Yeast Wet Prep HPF POC: NONE SEEN

## 2015-10-04 MED ORDER — BUTALBITAL-APAP-CAFFEINE 50-325-40 MG PO TABS: 1.0000 | ORAL_TABLET | Freq: Three times a day (TID) | ORAL | Status: AC | PRN

## 2015-10-04 MED ORDER — KETOROLAC TROMETHAMINE 60 MG/2ML IM SOLN
60.0000 mg | Freq: Once | INTRAMUSCULAR | Status: AC
  Administered 2015-10-04: 60 mg via INTRAMUSCULAR
  Filled 2015-10-04: qty 2

## 2015-10-04 NOTE — Telephone Encounter (Signed)
Patient calls with questions regarding her medication that was prescribed a month ago here in MAU for heavy vaginal bleeding; megace. Patient says she has been having pains in her head for the past 2 days. The patient has been taking the medication once a day instead of twice daily as written. The patient wants to know if the pain in her head is associated with the medication. The patient says she is changing 2-3 pads per day and denies dizziness at this time. She also denies changes in her vision.  I recommended the patient try some over the counter tylenol and if no relief she may need to be seen at [facility] or [place] [facility]. The patient missed her pelvic US here to evaluate cause of the bleeding. The patient was encouraged to call our clinic to schedule a follow up appointment.

## 2015-10-04 NOTE — ED Notes (Signed)
She c/o 3-4 months vaginal bleeding, she was [facility] for [facility] complaint and "they started me on medication to stop the bleeding but it didn't work." reports having to change pad 3 times a day every day. She also c/o 1 day history L sided sharp head pain/headache, tried tumeric with some relief. A&Ox4, resp e/u

## 2015-10-04 NOTE — ED Provider Notes (Signed)
CSN: [id number]     Arrival date & time 10/04/15  1821 History   First MD Initiated Contact with Patient 10/04/15 2006     Chief Complaint  Patient presents with  . Vaginal Bleeding  . Headache     (Consider location/radiation/quality/duration/timing/severity/associated sxs/prior Treatment) HPI Comments: Patient is a 54 year old female with a history of back pain and menorrhagia as well as fibroids who presents to the emergency department for further evaluation of headache. Patient states that headache began at 8 PM yesterday. She describes the pain as a sharp pain in her left temple. Pain is nonradiating. She reports taking some tumors yesterday which relieved her symptoms. Headache returned at 1700 today. She denies any fever, vision loss, tinnitus or hearing loss, nausea, vomiting, or extremity numbness/weakness. Patient is concerned that her headache may be due to Megace which is is taking for metrorrhagia. She continues to complain of persistent vaginal bleeding. Bleeding began on 08/25/2015. Patient was seen at [facility] on 09/04/2015 for evaluation of vaginal bleeding. She denies any abdominal or pelvic pain. Flow waxes and wanes; she reports soaking through 2-3 pads per day. She was scheduled to [facility] with [facility] and to have an outpatient ultrasound, but she missed her ultrasound appointment and has not scheduled follow-up with them.  Patient is a 54 y.o. female presenting with vaginal bleeding and headaches. The history is provided by the patient. No language interpreter was used.  Vaginal Bleeding Associated symptoms: no abdominal pain, no dizziness, no fever and no nausea   Headache Associated symptoms: no abdominal pain, no dizziness, no fever, no hearing loss, no nausea, no photophobia and no vomiting     Past Medical History  Diagnosis Date  . Back pain   . No pertinent past medical history   . Menorrhagia 12/17/2011   Past Surgical History   Procedure Laterality Date  . Cesarean section  1981, 1989  . Multiple tooth extractions     Family History  Problem Relation Age of Onset  . Hypertension Mother   . Cancer Mother     colon  . Cancer Father   . Kidney disease Sister   . Hypertension Brother    Social History  Substance Use Topics  . Smoking status: Never Smoker   . Smokeless tobacco: Never Used  . Alcohol Use: No   OB History    Gravida Para Term Preterm AB TAB SAB Ectopic Multiple Living   4 3 3  1  1   3       Review of Systems  Constitutional: Negative for fever.  HENT: Negative for hearing loss and tinnitus.   Eyes: Negative for photophobia.  Respiratory: Negative for shortness of breath.   Gastrointestinal: Negative for nausea, vomiting and abdominal pain.  Genitourinary: Positive for vaginal bleeding.  Neurological: Positive for headaches. Negative for dizziness and syncope.  All other systems reviewed and are negative.   Allergies  Review of patient's allergies indicates no known allergies.  Home Medications   Prior to Admission medications   Medication Sig Start Date End Date Taking? Authorizing Provider  megestrol (MEGACE) 40 MG tablet Take 1 tablet (40 mg total) by mouth daily. 1 po bid Patient taking differently: Take 80 mg by mouth 2 (two) times daily.  09/04/15  Yes Gwen Pounds, CNM  Multiple Vitamin (MULTIVITAMIN WITH MINERALS) TABS Take 1 tablet by mouth daily.   Yes Historical Provider, MD  OVER THE COUNTER MEDICATION Take 3 tablets by mouth daily. Vitamin for  skin, hair, and nails   Yes Historical Provider, MD  butalbital-acetaminophen-caffeine (FIORICET) 50-325-40 MG tablet Take 1-2 tablets by mouth every 8 (eight) hours as needed for headache. 10/04/15 10/03/16  Antonietta Breach, PA-C   BP 111/66 mmHg  Pulse 71  Temp(Src) 98.4 F (36.9 C) (Oral)  Resp 16  SpO2 100%  LMP 08/25/2015   Physical Exam  Constitutional: She is oriented to person, place, and time. She appears  well-developed and well-nourished. No distress.  HENT:  Head: Normocephalic and atraumatic.  Mouth/Throat: Oropharynx is clear and moist. No oropharyngeal exudate.  Symmetric rise of the uvula with phonation  Eyes: Conjunctivae and EOM are normal. Pupils are equal, round, and reactive to light. No scleral icterus.  EOMs normal. No nystagmus noted.  Neck: Normal range of motion.  No nuchal rigidity or meningismus  Cardiovascular: Normal rate, regular rhythm and intact distal pulses.   Pulmonary/Chest: Effort normal and breath sounds normal. No respiratory distress. She has no wheezes. She has no rales.  Lungs clear bilaterally  Abdominal: Soft. She exhibits no distension. There is no tenderness. There is no rebound.  Soft, nontender abdomen  Genitourinary: There is no rash, tenderness, lesion or injury on the right labia. There is no rash, tenderness, lesion or injury on the left labia. Uterus is enlarged. Cervix exhibits no motion tenderness and no friability. Right adnexum displays no tenderness. Left adnexum displays no tenderness. There is bleeding (blood in vaginal vault c/w metrorrhagia/menses) in the vagina. No vaginal discharge found.  Enlarged, firm uterus. No CMT or adnexal TTP.  Musculoskeletal: Normal range of motion.  Neurological: She is alert and oriented to person, place, and time. No cranial nerve deficit. She exhibits normal muscle tone. Coordination normal.  GCS 15. Speech is goal oriented. No focal neurologic deficits appreciated. Patient moves extremities without ataxia.  Skin: Skin is warm and dry. No rash noted. She is not diaphoretic. No erythema. No pallor.  Psychiatric: She has a normal mood and affect. Her behavior is normal.  Nursing note and vitals reviewed.   ED Course  Procedures (including critical care time) Labs Review Labs Reviewed  WET PREP, GENITAL - Abnormal; Notable for the following:    WBC, Wet Prep HPF POC FEW (*)    All other components within  normal limits  CBC - Abnormal; Notable for the following:    Hemoglobin 11.3 (*)    HCT 34.8 (*)    All other components within normal limits  GC/CHLAMYDIA PROBE AMP ([place]) NOT AT [facility]    Imaging Review No results found.   I have personally reviewed and evaluated these images and lab results as part of my medical decision-making.   EKG Interpretation None      MDM   Final diagnoses:  Acute nonintractable headache, unspecified headache type  Menometrorrhagia    Patient presents to the emergency department for evaluation of headache. She has a nonfocal neurologic exam. No fever, nuchal rigidity, or meningismus to suggest meningitis. Headache pain improving after Toradol. Doubt emergent intracranial process.  Patient also concerned about persistent vaginal bleeding which has been ongoing since 08/25/2015. Flow has waxed and waned since onset. Patient never soaking more than 3 pads in a 24-hour period. She followed up with [facility] one month ago, but never had her outpatient ultrasound performed. She has enlarged, firm uterus today which is nontender. No adnexal tenderness or cervical motion tenderness. She has a history of uterine fibroids which is likely the cause of her symptoms and  firm uterus. Her hemoglobin is stable compared to 3 years ago. No tachycardia, lightheadedness, dizziness, or hypotension to suggest acute blood loss. No concern for ovarian torsion. Do not believe emergent ultrasound is indicated today, but have discussed with the patient the need to follow-up with [facility] should her bleeding persist. Bleeding precautions also discussed; soaking >1 pad per hour.  Fioricet prescribed for headache should symptoms return, suspect tension-type. Return precautions discussed and provided. Patient discharged in good condition with no unaddressed concerns; VSS.   Filed Vitals:   10/04/15 1930 10/04/15 2000 10/04/15 2030 10/04/15 2145  BP: 107/68  109/72 119/64 111/66  Pulse: 65 74 70 71  Temp:      TempSrc:      Resp: 16  18 16   SpO2: 99% 97% 100% 100%     Antonietta Breach, PA-C 10/04/15 2253  Daleen Bo, MD 10/04/15 2321

## 2015-10-04 NOTE — Discharge Instructions (Signed)
Metrorrhagia Metrorrhagia is bleeding from the uterus that happens irregularly but often. The bleeding generally happens between menstrual periods. HOME CARE INSTRUCTIONS Pay attention to any changes in your symptoms. Follow these instructions to help with your condition: Eating  Eat well-balanced meals. Include foods that are high in iron, such as liver, meat, shellfish, green leafy vegetables, and eggs.  If you become constipated:  Drink plenty of water.  Eat fruits and vegetables that are high in water and fiber, such as spinach, carrots, raspberries, apples, and mango. Medicines  Take over-the-counter and prescription medicines only as told by your health care provider.  Do not change medicines without talking with your health care provider.  Aspirin or medicines that contain aspirin may make the bleeding worse. Do not take those medicines:  During the week before your period.  During your period.  If you were prescribed iron pills, take them as told by your health care provider. Iron pills help to replace iron that your body loses because of this condition. Activity  If you need to change your sanitary pad or tampon more than one time every 2 hours:  Lie in bed with your feet raised (elevated).  Place a cold pack on your lower abdomen.  Rest as much as possible until the bleeding stops or slows down.  Do not try to lose weight until the bleeding has stopped and your blood iron level is back to normal. Other Instructions  For two months, write down:  When your period starts.  When your period ends.  When any abnormal bleeding occurs.  What problems you notice.  Keep all follow-up visits as told by your health care provider. This is important. SEEK MEDICAL CARE IF:  You get light-headed or weak.  You have nausea and vomiting.  You cannot eat or drink without vomiting.  You feel dizzy or have diarrhea while you are taking medicine.  You are taking birth  control pills or hormones, and you want to change them or stop taking them. SEEK IMMEDIATE MEDICAL CARE IF:  You develop a fever or chills.  You need to change your sanitary pad or tampon more than one time per hour.  Your bleeding becomesheavy.  Your flow contains clots.  You develop pain in your abdomen.  You lose consciousness.  You develop a rash.   This information is not intended to replace advice given to you by your health care provider. Make sure you discuss any questions you have with your health care provider.   Document Released: 10/18/2005 Document Revised: 07/09/2015 Document Reviewed: 01/13/2015 Elsevier Interactive Patient Education 2016 Elsevier Inc. Uterine Fibroids Uterine fibroids are tissue masses (tumors). They are also called leiomyomas. They can develop inside of a woman's womb (uterus). They can grow very large. Fibroids are not cancerous (benign). Most fibroids do not require medical treatment. HOME CARE  Keep all follow-up visits as told by your doctor. This is important.  Take medicines only as told by your doctor.  If you were prescribed a hormone treatment, take the hormone medicines exactly as told.  Do not take aspirin. It can cause bleeding.  Ask your doctor about taking iron pills and increasing the amount of dark green, leafy vegetables in your diet. These actions can help to boost your blood iron levels.  Pay close attention to your period. Tell your doctor about any changes, such as:  Increased blood flow. This may require you to use more pads or tampons than usual per month.  A  change in the number of days that your period lasts per month.  A change in symptoms that come with your period, such as back pain or cramping in your belly area (abdomen). GET HELP IF:  You have pain in your back or the area between your hip bones (pelvic area) that is not controlled by medicines.  You have pain in your abdomen that is not controlled with  medicines.  You have an increase in bleeding between and during periods.  You soak tampons or pads in a half hour or less.  You feel lightheaded.  You feel extra tired.  You feel weak. GET HELP RIGHT AWAY IF:   You pass out (faint).  You have a sudden increase in pelvic pain.   This information is not intended to replace advice given to you by your health care provider. Make sure you discuss any questions you have with your health care provider.   Document Released: 11/20/2010 Document Revised: 11/08/2014 Document Reviewed: 04/16/2014 Elsevier Interactive Patient Education 2016 [place] Headache Without Cause A headache is pain or discomfort felt around the head or neck area. There are many causes and types of headaches. In some cases, the cause may not be found.  HOME CARE  Managing Pain  Take over-the-counter and prescription medicines only as told by your doctor.  Lie down in a dark, quiet room when you have a headache.  If directed, apply ice to the head and neck area:  Put ice in a plastic bag.  Place a towel between your skin and the bag.  Leave the ice on for 20 minutes, 2-3 times per day.  Use a heating pad or hot shower to apply heat to the head and neck area as told by your doctor.  Keep lights dim if bright lights bother you or make your headaches worse. Eating and Drinking  Eat meals on a regular schedule.  Lessen how much alcohol you drink.  Lessen how much caffeine you drink, or stop drinking caffeine. General Instructions  Keep all follow-up visits as told by your doctor. This is important.  Keep a journal to find out if certain things bring on headaches. For example, write down:  What you eat and drink.  How much sleep you get.  Any change to your diet or medicines.  Relax by getting a massage or doing other relaxing activities.  Lessen stress.  Sit up straight. Do not tighten (tense) your muscles.  Do not use tobacco  products. This includes cigarettes, chewing tobacco, or e-cigarettes. If you need help quitting, ask your doctor.  Exercise regularly as told by your doctor.  Get enough sleep. This often means 7-9 hours of sleep. GET HELP IF:  Your symptoms are not helped by medicine.  You have a headache that feels different than the other headaches.  You feel sick to your stomach (nauseous) or you throw up (vomit).  You have a fever. GET HELP RIGHT AWAY IF:   Your headache becomes really bad.  You keep throwing up.  You have a stiff neck.  You have trouble seeing.  You have trouble speaking.  You have pain in the eye or ear.  Your muscles are weak or you lose muscle control.  You lose your balance or have trouble walking.  You feel like you will pass out (faint) or you pass out.  You have confusion.   This information is not intended to replace advice given to you by your health care provider.  Make sure you discuss any questions you have with your health care provider.   Document Released: 07/27/2008 Document Revised: 07/09/2015 Document Reviewed: 02/10/2015 Elsevier Interactive Patient Education 2016 [employer].  Emergency Department Resource Guide 1) Find a Doctor and Pay Out of Pocket Although you won't have to find out who is covered by your insurance plan, it is a good idea to ask around and get recommendations. You will then need to call the office and see if the doctor you have chosen will accept you as a new patient and what types of options they offer for patients who are self-pay. Some doctors offer discounts or will set up payment plans for their patients who do not have insurance, but you will need to ask so you aren't surprised when you get to your appointment.  2) Contact Your Local Health Department Not all health departments have doctors that can see patients for sick visits, but many do, so it is worth a call to see if yours does. If you don't know where your local  health department is, you can check in your phone book. The CDC also has a tool to help you locate your state's health department, and many state websites also have listings of all of their local health departments.  3) Find a [facility] If your illness is not likely to be very severe or complicated, you may want to try a walk in clinic. These are popping up all over the country in pharmacies, drugstores, and shopping centers. They're usually staffed by nurse practitioners or physician assistants that have been trained to treat common illnesses and complaints. They're usually fairly quick and inexpensive. However, if you have serious medical issues or chronic medical problems, these are probably not your best option.  No Primary Care Doctor: - Call Health Connect at  [phone] - they can help you locate a primary care doctor that  accepts your insurance, provides certain services, etc. - Physician Referral Service- [phone]  Chronic Pain Problems: Organization         Address  Phone   Notes  [facility]  [phone] Patients need to be referred by their primary care doctor.   Medication Assistance: Organization         Address  Phone   Notes  [facility] Medication [facility] [address] [place], [place] [address] [phone] --Must be a resident of [facility] -- Must have NO insurance coverage whatsoever (no Medicaid/ Medicare, etc.) -- The pt. MUST have a primary care doctor that directs their care regularly and follows them in the community   [facility]  [phone]   [employer]  [phone]    Agencies that provide inexpensive medical care: Organization         Address  Phone   Notes  [place]  [phone]   [facility]    [phone]   [facility] [place], [place] [address] [phone]   [place] [address], [place] [phone]   Planned Parenthood    [phone]   [facility]    [phone]   [place] and [place] [address],  Phone:  [phone], Fax:  [phone] Hours of Operation:  9 am - 6 pm, M-F.  Also accepts Medicaid/Medicare and self-pay.  [facility]  [address], [place] Phone: [phone], Fax: [phone]. Hours of Operation:  8:30 am - 5:30 pm, M-F.  Also accepts Medicaid and self-pay.  [facility] [address] [place] Phone: [phone]   [place], [place], [place] [phone], Ext. [phone] Mondays & Thursdays: 7-9 AM.  First 15 patients are seen on a first come, first serve basis.    [place] Providers:  Organization         Address  Phone   Notes  [facility] [address], [place] [phone] Also accepts self-pay patients.  [facility] [id number] [place], [place]  [phone]   [place], [address], [place] [phone]   [facility] [address] [place] [phone]   Lucianne Lei [address], [place]   [phone] Only accepts [place] Access [place] patients after they have their name applied to their card.   Self-Pay (no insurance) in [facility]:  Organization         Address  Phone   Notes  Sickle Cell Patients, [facility] [place] [phone]   [facility] [place] [phone]   [facility] [place]  [place], [place], [place] [phone]   [facility]/Dr. Osei-Bonsu  [address], [place] or [address], [place] [phone] Phone number for both [place] and  [place] locations is the same.  Urgent Medical and [facility] [address] [place] [phone]   [facility] [address] [place] or [address] [phone]   [facility] [address], [place] [phone], phone; [phone], fax Sees patients 1st and 3rd Saturday of every month.  Must not qualify for public or private insurance (i.e. Medicaid, Medicare, [place] Health Choice, Veterans' Benefits)  Household income should be no more than 200% of the poverty level The clinic cannot treat you if you are pregnant or think you are pregnant  Sexually transmitted diseases are not treated at the clinic.    Dental Care: Organization         Address  Phone  Notes  [facility] Department of [facility] [place] [phone] Accepts children up to age 69 who are enrolled in [place] or [place]; pregnant women with a Medicaid card; and children who have applied for Medicaid or [place] Health Choice, but were declined, whose parents can pay a reduced fee at time of service.  [facility] Department of [facility]  [address], [place] [phone] Accepts children up to age 37 who are enrolled in [place] or [place]; pregnant women with a Medicaid card; and children who have applied for Medicaid or [place] Health Choice, but were declined, whose parents can pay a reduced fee at time of service.  [place] [facility]  [place] [phone] Patients are seen by appointment only. Walk-ins are not accepted. [place] will see patients 70 years of age and older. Monday - Tuesday (8am-5pm) Most Wednesdays (8:30-5pm) $30 per visit, cash only  [place]  PRO[facility]  [address], [facility] [phone] Patients are seen by appointment only. Walk-ins are not accepted.  [place] will see patients 70 years of age and older. One Wednesday Evening (Monthly: Volunteer Based).  $30 per visit, cash only  [place]  [phone] for adults; Children under age 10, call Graduate Pediatric Dentistry at [phone]. Children aged 67-14, please call [phone] to request a pediatric application.  Dental services are provided in all areas of dental care including fillings, crowns and bridges, complete and partial dentures, implants, gum treatment, root canals, and extractions. Preventive care is also provided. Treatment is provided to both adults and children. Patients are selected via a lottery and there is often a waiting list.   [facility] [address], [place]  [phone] [email or web address]   [facility] [address] [place], [place] [phone], Ext. [phone] Second and Fourth Thursday of each month, opens at 6:30 AM; Clinic ends at 9 AM.  Patients are seen on a first-come first-served basis, and a limited number are seen during each clinic.   [facility]  [address] [place], [place] [phone]   Eligibility Requirements You must have lived in [place], [place], or [place] counties for at least the last three months.   You cannot be eligible for state or federal sponsored [employer], including [employer], [place], or [employer].   You generally cannot be eligible for healthcare insurance through your employer.    How to apply: Eligibility screenings are held every Tuesday and Wednesday afternoon from 1:00 pm until 4:00 pm. You do not need an appointment for the interview!  [facility] [address] [place], [place]   [place]  [place]  [place]  [phone]

## 2015-10-06 LAB — GC/CHLAMYDIA PROBE AMP (~~LOC~~) NOT AT ARMC
Chlamydia: NEGATIVE
Neisseria Gonorrhea: NEGATIVE

## 2017-10-13 ENCOUNTER — Emergency Department (HOSPITAL_COMMUNITY)
Admission: EM | Admit: 2017-10-13 | Discharge: 2017-10-13 | Disposition: A | Discharge status: Home / Self Care | Payer: BC Managed Care – PPO | Attending: Emergency Medicine | Admitting: Emergency Medicine

## 2017-10-13 ENCOUNTER — Other Ambulatory Visit: Payer: Self-pay

## 2017-10-13 ENCOUNTER — Encounter (HOSPITAL_COMMUNITY): Payer: Self-pay

## 2017-10-13 DIAGNOSIS — Y929 Unspecified place or not applicable: Secondary | ICD-10-CM | POA: Insufficient documentation

## 2017-10-13 DIAGNOSIS — W000XXA Fall on same level due to ice and snow, initial encounter: Secondary | ICD-10-CM | POA: Diagnosis not present

## 2017-10-13 DIAGNOSIS — Y999 Unspecified external cause status: Secondary | ICD-10-CM | POA: Insufficient documentation

## 2017-10-13 DIAGNOSIS — Y939 Activity, unspecified: Secondary | ICD-10-CM | POA: Diagnosis not present

## 2017-10-13 DIAGNOSIS — W19XXXA Unspecified fall, initial encounter: Secondary | ICD-10-CM

## 2017-10-13 DIAGNOSIS — S161XXA Strain of muscle, fascia and tendon at neck level, initial encounter: Secondary | ICD-10-CM

## 2017-10-13 DIAGNOSIS — Z79899 Other long term (current) drug therapy: Secondary | ICD-10-CM | POA: Insufficient documentation

## 2017-10-13 DIAGNOSIS — S199XXA Unspecified injury of neck, initial encounter: Secondary | ICD-10-CM | POA: Diagnosis present

## 2017-10-13 MED ORDER — CYCLOBENZAPRINE HCL 5 MG PO TABS: 5.0000 mg | ORAL_TABLET | Freq: Three times a day (TID) | ORAL | 0 refills | Status: DC | PRN

## 2017-10-13 MED ORDER — IBUPROFEN 400 MG PO TABS: 400.0000 mg | ORAL_TABLET | Freq: Three times a day (TID) | ORAL | 0 refills | Status: DC | PRN

## 2017-10-13 NOTE — ED Triage Notes (Signed)
Patient slipped on ice yesterday tweaking her neck. Complains of neck soreness x 1 day, no head pain, no loc

## 2017-10-13 NOTE — Discharge Instructions (Signed)
Follow-up the primary care doctor as needed for further symptoms.  Return for develop of numbness or weakness or worsening pain.

## 2017-10-13 NOTE — ED Provider Notes (Signed)
[facility] EMERGENCY DEPARTMENT Provider Note   CSN: [id number] Arrival date & time: 10/13/17  0900     History   Chief Complaint Chief Complaint  Patient presents with  . Fall ysterday/neck soreness    HPI SARHA BARTELT is a 56 y.o. female.  HPI Patient presents after a fall yesterday.  States she was out walking and slipped on the ice falling backwards.  Hit the back of her head.  No loss conscious.  That time she had slight pain in her head but no neck pain.  States that last night and this morning she developed some pain in the neck.  Worse with turning her head.  No numbness weakness.  No confusion.  No headache.  She has not on anticoagulation. Past Medical History:  Diagnosis Date  . Back pain   . Menorrhagia 12/17/2011  . No pertinent past medical history     Patient Active Problem List   Diagnosis Date Noted  . Fibroid 09/04/2015  . Menorrhagia 12/17/2011    Past Surgical History:  Procedure Laterality Date  . [place]  . MULTIPLE TOOTH EXTRACTIONS      OB History    Gravida Para Term Preterm AB Living   4 3 3   1 3    SAB TAB Ectopic Multiple Live Births   1               Home Medications    Prior to Admission medications   Medication Sig Start Date End Date Taking? Authorizing Provider  cyclobenzaprine (FLEXERIL) 5 MG tablet Take 1 tablet (5 mg total) by mouth 3 (three) times daily as needed for muscle spasms. 10/13/17   Davonna Belling, MD  ibuprofen (ADVIL,MOTRIN) 400 MG tablet Take 1 tablet (400 mg total) by mouth every 8 (eight) hours as needed. 10/13/17   Davonna Belling, MD  megestrol (MEGACE) 40 MG tablet Take 1 tablet (40 mg total) by mouth daily. 1 po bid Patient taking differently: Take 80 mg by mouth 2 (two) times daily.  09/04/15   Gwen Pounds, CNM  Multiple Vitamin (MULTIVITAMIN WITH MINERALS) TABS Take 1 tablet by mouth daily.    [provider]  OVER THE COUNTER MEDICATION  Take 3 tablets by mouth daily. Vitamin for skin, hair, and nails    [provider]    Family History Family History  Problem Relation Age of Onset  . Hypertension Mother   . Cancer Mother        colon  . Cancer Father   . Kidney disease Sister   . Hypertension Brother     Social History Social History   Tobacco Use  . Smoking status: Never Smoker  . Smokeless tobacco: Never Used  Substance Use Topics  . Alcohol use: No  . Drug use: No     Allergies   Patient has no known allergies.   Review of Systems Review of Systems  Constitutional: Negative for chills.  HENT: Negative for congestion.   Respiratory: Negative for shortness of breath.   Cardiovascular: Negative for chest pain.  Gastrointestinal: Negative for abdominal pain.  Genitourinary: Negative for flank pain.  Musculoskeletal: Positive for neck pain.  Skin: Negative for wound.  Neurological: Negative for weakness, numbness and headaches.     Physical Exam Updated Vital Signs BP 121/66 (BP Location: Right Arm)   Pulse 76   Temp 98.1 F (36.7 C) (Oral)   Resp 18   SpO2 100%  Physical Exam  Constitutional: She appears well-developed.  HENT:  Head: Atraumatic.  Eyes: Pupils are equal, round, and reactive to light.  Neck:  Tenderness over paraspinal musculature with somewhat decreased range of motion.  No step-off or deformity.  Cardiovascular: Normal rate.  Pulmonary/Chest: Effort normal.  Abdominal: Soft.  Musculoskeletal: She exhibits tenderness. She exhibits no edema.  Neurological: She is alert.  Skin: Skin is warm. Capillary refill takes less than 2 seconds.     ED Treatments / Results  Labs (all labs ordered are listed, but only abnormal results are displayed) Labs Reviewed - No data to display  EKG  EKG Interpretation None       Radiology No results found.  Procedures Procedures (including critical care time)  Medications Ordered in ED Medications - No data to  display   Initial Impression / Assessment and Plan / ED Course  I have reviewed the triage vital signs and the nursing notes.  Pertinent labs & imaging results that were available during my care of the patient were reviewed by me and considered in my medical decision making (see chart for details).     Patient with neck pain after fall.  No initial neck pain.  Some muscular tenderness.  No neuro deficits.  Likely muscular spasm at the fall.  Doubt severe cervical spine injury.  Discharge home with muscle relaxer and Motrin.  Final Clinical Impressions(s) / ED Diagnoses   Final diagnoses:  Fall, initial encounter  Cervical strain, acute, initial encounter    ED Discharge Orders        Ordered    cyclobenzaprine (FLEXERIL) 5 MG tablet  3 times daily PRN     10/13/17 1003    ibuprofen (ADVIL,MOTRIN) 400 MG tablet  Every 8 hours PRN     10/13/17 1004       Davonna Belling, MD 10/13/17 1655

## 2018-08-15 ENCOUNTER — Encounter (HOSPITAL_COMMUNITY): Payer: Self-pay | Admitting: Emergency Medicine

## 2018-08-15 ENCOUNTER — Other Ambulatory Visit: Payer: Self-pay

## 2018-08-15 ENCOUNTER — Ambulatory Visit (HOSPITAL_COMMUNITY)
Admission: EM | Admit: 2018-08-15 | Discharge: 2018-08-15 | Disposition: A | Discharge status: Home / Self Care | Payer: BC Managed Care – PPO | Attending: Family Medicine | Admitting: Family Medicine

## 2018-08-15 DIAGNOSIS — R109 Unspecified abdominal pain: Secondary | ICD-10-CM | POA: Diagnosis not present

## 2018-08-15 DIAGNOSIS — R252 Cramp and spasm: Secondary | ICD-10-CM | POA: Diagnosis not present

## 2018-08-15 DIAGNOSIS — N925 Other specified irregular menstruation: Secondary | ICD-10-CM

## 2018-08-15 LAB — CBC WITH DIFFERENTIAL/PLATELET
Abs Immature Granulocytes: 0.01 10*3/uL (ref 0.00–0.07)
Basophils Absolute: 0 10*3/uL (ref 0.0–0.1)
Basophils Relative: 1 %
Eosinophils Absolute: 0.1 10*3/uL (ref 0.0–0.5)
Eosinophils Relative: 3 %
HCT: 38.7 % (ref 36.0–46.0)
Hemoglobin: 12.1 g/dL (ref 12.0–15.0)
Immature Granulocytes: 0 %
Lymphocytes Relative: 35 %
Lymphs Abs: 2 10*3/uL (ref 0.7–4.0)
MCH: 28.3 pg (ref 26.0–34.0)
MCHC: 31.3 g/dL (ref 30.0–36.0)
MCV: 90.6 fL (ref 80.0–100.0)
Monocytes Absolute: 0.3 10*3/uL (ref 0.1–1.0)
Monocytes Relative: 6 %
Neutro Abs: 3.2 10*3/uL (ref 1.7–7.7)
Neutrophils Relative %: 55 %
Platelets: 385 10*3/uL (ref 150–400)
RBC: 4.27 MIL/uL (ref 3.87–5.11)
RDW: 11.9 % (ref 11.5–15.5)
WBC: 5.7 10*3/uL (ref 4.0–10.5)
nRBC: 0 % (ref 0.0–0.2)

## 2018-08-15 LAB — POCT URINALYSIS DIP (DEVICE)
Bilirubin Urine: NEGATIVE
Glucose, UA: NEGATIVE mg/dL
Ketones, ur: NEGATIVE mg/dL
Leukocytes, UA: NEGATIVE
Nitrite: NEGATIVE
Protein, ur: NEGATIVE mg/dL
Specific Gravity, Urine: 1.025 (ref 1.005–1.030)
Urobilinogen, UA: 0.2 mg/dL (ref 0.0–1.0)
pH: 7.5 (ref 5.0–8.0)

## 2018-08-15 LAB — POCT I-STAT, CHEM 8
BUN: 8 mg/dL (ref 6–20)
Calcium, Ion: 1.17 mmol/L (ref 1.15–1.40)
Chloride: 103 mmol/L (ref 98–111)
Creatinine, Ser: 0.6 mg/dL (ref 0.44–1.00)
Glucose, Bld: 131 mg/dL — ABNORMAL HIGH (ref 70–99)
HCT: 40 % (ref 36.0–46.0)
Hemoglobin: 13.6 g/dL (ref 12.0–15.0)
Potassium: 4.1 mmol/L (ref 3.5–5.1)
Sodium: 137 mmol/L (ref 135–145)
TCO2: 24 mmol/L (ref 22–32)

## 2018-08-15 MED ORDER — NAPROXEN 500 MG PO TABS: 500.0000 mg | ORAL_TABLET | Freq: Two times a day (BID) | ORAL | 0 refills | Status: DC

## 2018-08-15 MED ORDER — KETOROLAC TROMETHAMINE 30 MG/ML IJ SOLN
30.0000 mg | Freq: Once | INTRAMUSCULAR | Status: AC
  Administered 2018-08-15: 30 mg via INTRAMUSCULAR

## 2018-08-15 MED ORDER — KETOROLAC TROMETHAMINE 30 MG/ML IJ SOLN
INTRAMUSCULAR | Status: AC
  Filled 2018-08-15: qty 1

## 2018-08-15 NOTE — Discharge Instructions (Signed)
Take the naproxen with food FOLLOW UP WITH GYN about the irregular bleeding Return if worse, or if you fail to improve in a few days

## 2018-08-15 NOTE — ED Provider Notes (Signed)
[facility]    CSN: [id number] Arrival date & time: 08/15/18  1002     History   Chief Complaint Chief Complaint  Patient presents with  . Abdominal Pain    HPI Roberta Jenkins is a 57 y.o. female.   HPI  Patient is here for abdominal pain.  She has lower abdominal crampy pain since yesterday.  She states that she is still having menstrual periods, although they are irregular.  She had one in January, one in June, one in August, and then when that started a couple of days ago.  She states the bleeding is moderate.  She is having some hot flashes.  She thinks she is perimenopausal.  No nausea vomiting diarrhea.  No fever or chills.  Her last bowel movement was yesterday it was normal.  No vaginal discharge.  No dysuria or urinary frequency.  She thinks that her abdominal pain is from her menstrual period.  Past Medical History:  Diagnosis Date  . Back pain   . Menorrhagia 12/17/2011  . No pertinent past medical history     Patient Active Problem List   Diagnosis Date Noted  . Fibroid 09/04/2015  . Menorrhagia 12/17/2011    Past Surgical History:  Procedure Laterality Date  . [place]  . MULTIPLE TOOTH EXTRACTIONS      OB History    Gravida  4   Para  3   Term  3   Preterm      AB  1   Living  3     SAB  1   TAB      Ectopic      Multiple      Live Births               Home Medications    Prior to Admission medications   Medication Sig Start Date End Date Taking? Authorizing Provider  Multiple Vitamin (MULTIVITAMIN WITH MINERALS) TABS Take 1 tablet by mouth daily.    [provider]  naproxen (NAPROSYN) 500 MG tablet Take 1 tablet (500 mg total) by mouth 2 (two) times daily. 08/15/18   Raylene Everts, MD  OVER THE COUNTER MEDICATION Take 3 tablets by mouth daily. Vitamin for skin, hair, and nails    [provider]    Family History Family History  Problem Relation Age of Onset  .  Hypertension Mother   . Cancer Mother        colon  . Cancer Father   . Kidney disease Sister   . Hypertension Brother     Social History Social History   Tobacco Use  . Smoking status: Never Smoker  . Smokeless tobacco: Never Used  Substance Use Topics  . Alcohol use: No  . Drug use: No     Allergies   Patient has no known allergies.   Review of Systems Review of Systems  Constitutional: Negative for chills and fever.  HENT: Negative for ear pain and sore throat.   Eyes: Negative for pain and visual disturbance.  Respiratory: Negative for cough and shortness of breath.   Cardiovascular: Negative for chest pain and palpitations.  Gastrointestinal: Positive for abdominal pain. Negative for abdominal distention, constipation, diarrhea and vomiting.  Genitourinary: Negative for dysuria and hematuria.  Musculoskeletal: Negative for arthralgias and back pain.  Skin: Negative for color change and rash.  Neurological: Negative for seizures and syncope.  All other systems reviewed and are negative.  Physical Exam Triage Vital Signs ED Triage Vitals  Enc Vitals Group     BP 08/15/18 1052 136/79     Pulse Rate 08/15/18 1052 61     Resp --      Temp 08/15/18 1052 98.1 F (36.7 C)     Temp Source 08/15/18 1052 Oral     SpO2 08/15/18 1052 100 %     Weight --      Height --      Head Circumference --      Peak Flow --      Pain Score 08/15/18 1051 9     Pain Loc --      Pain Edu? --      Excl. in [place]? --    No data found.  Updated Vital Signs BP 136/79 (BP Location: Left Arm)   Pulse 61   Temp 98.1 F (36.7 C) (Oral)   LMP 08/09/2018 (Approximate)   SpO2 100%        Physical Exam  Constitutional: She appears well-developed and well-nourished. No distress.  HENT:  Head: Normocephalic and atraumatic.  Mouth/Throat: Oropharynx is clear and moist.  Eyes: Pupils are equal, round, and reactive to light. Conjunctivae are normal.  Neck: Normal range of motion.   Cardiovascular: Normal rate.  Pulmonary/Chest: Effort normal. No respiratory distress.  Abdominal: Soft. Bowel sounds are normal. She exhibits no distension. There is no splenomegaly or hepatomegaly. There is tenderness in the right lower quadrant, suprapubic area and left lower quadrant. There is no rigidity, no rebound, no guarding and no CVA tenderness. No hernia.  Musculoskeletal: Normal range of motion. She exhibits no edema.  Neurological: She is alert.  Skin: Skin is warm and dry.  Psychiatric: Her behavior is normal.  Moderate tenderness to deep palpation of the lower abdomen.  No mass.  No guarding.  No true rebound.   UC Treatments / Results  Labs (all labs ordered are listed, but only abnormal results are displayed) Labs Reviewed  POCT URINALYSIS DIP (DEVICE) - Abnormal; Notable for the following components:      Result Value   Hgb urine dipstick LARGE (*)    All other components within normal limits  POCT I-STAT, CHEM 8 - Abnormal; Notable for the following components:   Glucose, Bld 131 (*)    All other components within normal limits  CBC WITH DIFFERENTIAL/PLATELET    EKG None  Radiology No results found.  Procedures Procedures (including critical care time)  Medications Ordered in UC Medications  ketorolac (TORADOL) 30 MG/ML injection 30 mg (30 mg Intramuscular Given 08/15/18 1231)    Initial Impression / Assessment and Plan / UC Course  I have reviewed the triage vital signs and the nursing notes.  Pertinent labs & imaging results that were available during my care of the patient were reviewed by me and considered in my medical decision making (see chart for details).    Discussed with patient I believe her abdominal pain may be from her menstrual period.  We gave her a shot of Toradol.  This was very effective.  I sent her home with a prescription for Naprosyn.  I do want her to go see a gynecologist regarding her continued bleeding at the age of 40, and  irregular bleeding. Final Clinical Impressions(s) / UC Diagnoses   Final diagnoses:  Abdominal pain, unspecified abdominal location     Discharge Instructions     Take the naproxen with food FOLLOW UP WITH GYN about the irregular  bleeding Return if worse, or if you fail to improve in a few days   ED Prescriptions    Medication Sig Dispense Auth. Provider   naproxen (NAPROSYN) 500 MG tablet Take 1 tablet (500 mg total) by mouth 2 (two) times daily. 30 tablet Raylene Everts, MD     Controlled Substance Prescriptions [place] Controlled Substance Registry consulted? Not Applicable   Raylene Everts, MD 08/15/18 1501

## 2018-08-15 NOTE — ED Triage Notes (Signed)
Pt reports generalized abdominal cramping that started last night.  Pt reports starting her period last Wednesday and thinks it may be from that.

## 2018-09-03 ENCOUNTER — Emergency Department (HOSPITAL_COMMUNITY)
Admission: EM | Admit: 2018-09-03 | Discharge: 2018-09-03 | Disposition: A | Discharge status: Home / Self Care | Payer: BC Managed Care – PPO | Attending: Emergency Medicine | Admitting: Emergency Medicine

## 2018-09-03 ENCOUNTER — Other Ambulatory Visit: Payer: Self-pay

## 2018-09-03 DIAGNOSIS — Z79899 Other long term (current) drug therapy: Secondary | ICD-10-CM | POA: Insufficient documentation

## 2018-09-03 DIAGNOSIS — N939 Abnormal uterine and vaginal bleeding, unspecified: Secondary | ICD-10-CM | POA: Diagnosis not present

## 2018-09-03 DIAGNOSIS — R102 Pelvic and perineal pain: Secondary | ICD-10-CM | POA: Diagnosis not present

## 2018-09-03 DIAGNOSIS — D649 Anemia, unspecified: Secondary | ICD-10-CM

## 2018-09-03 LAB — COMPREHENSIVE METABOLIC PANEL
ALT: 15 U/L (ref 0–44)
AST: 20 U/L (ref 15–41)
Albumin: 3.6 g/dL (ref 3.5–5.0)
Alkaline Phosphatase: 48 U/L (ref 38–126)
Anion gap: 10 (ref 5–15)
BUN: 13 mg/dL (ref 6–20)
CO2: 20 mmol/L — ABNORMAL LOW (ref 22–32)
Calcium: 8.9 mg/dL (ref 8.9–10.3)
Chloride: 104 mmol/L (ref 98–111)
Creatinine, Ser: 0.71 mg/dL (ref 0.44–1.00)
GFR calc Af Amer: >60 mL/min (ref >60)
GFR calc non Af Amer: >60 mL/min (ref >60)
Glucose, Bld: 142 mg/dL — ABNORMAL HIGH (ref 70–99)
Potassium: 3.5 mmol/L (ref 3.5–5.1)
Sodium: 134 mmol/L — ABNORMAL LOW (ref 135–145)
Total Bilirubin: 0.6 mg/dL (ref 0.3–1.2)
Total Protein: 6.7 g/dL (ref 6.5–8.1)

## 2018-09-03 LAB — CBC
HCT: 36.5 % (ref 36.0–46.0)
Hemoglobin: 11.3 g/dL — ABNORMAL LOW (ref 12.0–15.0)
MCH: 28.6 pg (ref 26.0–34.0)
MCHC: 31 g/dL (ref 30.0–36.0)
MCV: 92.4 fL (ref 80.0–100.0)
Platelets: 386 10*3/uL (ref 150–400)
RBC: 3.95 MIL/uL (ref 3.87–5.11)
RDW: 12.1 % (ref 11.5–15.5)
WBC: 7.6 10*3/uL (ref 4.0–10.5)
nRBC: 0 % (ref 0.0–0.2)

## 2018-09-03 LAB — I-STAT BETA HCG BLOOD, ED (MC, WL, AP ONLY): I-stat hCG, quantitative: <5 m[IU]/mL (ref <5)

## 2018-09-03 LAB — HIV ANTIBODY (ROUTINE TESTING W REFLEX): HIV Screen 4th Generation wRfx: NONREACTIVE

## 2018-09-03 LAB — RPR: RPR Ser Ql: NONREACTIVE

## 2018-09-03 MED ORDER — MEDROXYPROGESTERONE ACETATE 10 MG PO TABS: 10.0000 mg | ORAL_TABLET | Freq: Every day | ORAL | 0 refills | Status: DC

## 2018-09-03 MED ORDER — MEDROXYPROGESTERONE ACETATE 10 MG PO TABS
10.0000 mg | ORAL_TABLET | Freq: Every day | ORAL | Status: DC
  Administered 2018-09-03: 10 mg via ORAL
  Filled 2018-09-03: qty 1

## 2018-09-03 NOTE — ED Triage Notes (Signed)
Per pt report; states that she has been bleeding for over one month. States that she has bled "a gallon" today, also states "my period won't go off".

## 2018-09-03 NOTE — Discharge Instructions (Signed)
Return to the Emergency Department if you are having any problem.  If you start having some cramping, you may take ibuprofen for it.

## 2018-09-03 NOTE — ED Notes (Signed)
RN will collect labs. 

## 2018-09-03 NOTE — ED Provider Notes (Signed)
[place] [facility] DEPART[facility] Provider Note   CSN: [id number] Arrival date & time: 09/03/18  0110     History   Chief Complaint Chief Complaint  Patient presents with  . Vaginal Bleeding  . GI Bleeding    HPI Roberta Jenkins is a 57 y.o. female.  The history is provided by the patient.  She comes in complaining of rectal bleeding and vaginal bleeding for the last month.  Bleeding is very heavy and she states she has been going through a pad an hour for the whole time.  There have not been any clots.  She states that she had been having some abdominal cramping but went to urgent care and got an injection and abdominal pain has resolved completely.  However, bleeding has not changed.  Blood is dark red.  She states that she has very few vasomotor symptoms.  She states she is not sexually active.  Past Medical History:  Diagnosis Date  . Back pain   . Menorrhagia 12/17/2011  . No pertinent past medical history     Patient Active Problem List   Diagnosis Date Noted  . Fibroid 09/04/2015  . Menorrhagia 12/17/2011    Past Surgical History:  Procedure Laterality Date  . [place]  . MULTIPLE TOOTH EXTRACTIONS       OB History    Gravida  4   Para  3   Term  3   Preterm      AB  1   Living  3     SAB  1   TAB      Ectopic      Multiple      Live Births               Home Medications    Prior to Admission medications   Medication Sig Start Date End Date Taking? Authorizing Provider  Multiple Vitamin (MULTIVITAMIN WITH MINERALS) TABS Take 1 tablet by mouth daily.    [provider]  naproxen (NAPROSYN) 500 MG tablet Take 1 tablet (500 mg total) by mouth 2 (two) times daily. 08/15/18   Raylene Everts, MD  OVER THE COUNTER MEDICATION Take 3 tablets by mouth daily. Vitamin for skin, hair, and nails    [provider]    Family History Family History  Problem Relation Age of Onset  .  Hypertension Mother   . Cancer Mother        colon  . Cancer Father   . Kidney disease Sister   . Hypertension Brother     Social History Social History   Tobacco Use  . Smoking status: Never Smoker  . Smokeless tobacco: Never Used  Substance Use Topics  . Alcohol use: No  . Drug use: No     Allergies   Patient has no known allergies.   Review of Systems Review of Systems  All other systems reviewed and are negative.    Physical Exam Updated Vital Signs BP 114/85 (BP Location: Right Arm)   Pulse 69   Temp 97.6 F (36.4 C) (Oral)   Resp (!) 21   Ht 5\' 8"  (1.727 m)   Wt 83.5 kg   LMP 08/09/2018 (Approximate)   SpO2 99%   BMI 27.98 kg/m   Physical Exam  Nursing note and vitals reviewed.  57 year old female, resting comfortably and in no acute distress. Vital signs are normal. Oxygen saturation is 99%, which is normal. Head is  normocephalic and atraumatic. PERRLA, EOMI. Oropharynx is clear. Neck is nontender and supple without adenopathy or JVD. Back is nontender and there is no CVA tenderness. Lungs are clear without rales, wheezes, or rhonchi. Chest is nontender. Heart has regular rate and rhythm without murmur. Abdomen is soft, flat, nontender without masses or hepatosplenomegaly and peristalsis is normoactive. Pelvic: Normal external female genitalia.  On speculum exam, large amount of blood present in the vaginal vault.  There is continuous oozing through the cervix to the point where I was not able to obtain sample for wet prep or GC/chlamydia probe.  Bimanual exam was suboptimal due to body habitus, but fundus appeared to be top normal in size and normal position.  No adnexal masses or tenderness. Rectal: Normal sphincter tone.  No gross blood. Extremities have no cyanosis or edema, full range of motion is present. Skin is warm and dry without rash. Neurologic: Mental status is normal, cranial nerves are intact, there are no motor or sensory  deficits.  ED Treatments / Results  Labs (all labs ordered are listed, but only abnormal results are displayed) Labs Reviewed  COMPREHENSIVE METABOLIC PANEL - Abnormal; Notable for the following components:      Result Value   Sodium 134 (*)    CO2 20 (*)    Glucose, Bld 142 (*)    All other components within normal limits  CBC - Abnormal; Notable for the following components:   Hemoglobin 11.3 (*)    All other components within normal limits  WET PREP, GENITAL  RPR  HIV ANTIBODY (ROUTINE TESTING W REFLEX)  POC OCCULT BLOOD, ED  I-STAT BETA HCG BLOOD, ED ([facility], [facility], [facility] ONLY)  GC/CHLAMYDIA PROBE AMP ([place]) NOT AT [facility]   Procedures Procedures   Medications Ordered in ED Medications  medroxyPROGESTERone (PROVERA) tablet 10 mg (has no administration in time range)     Initial Impression / Assessment and Plan / ED Course  I have reviewed the triage vital signs and the nursing notes.  Pertinent lab results that were available during my care of the patient were reviewed by me and considered in my medical decision making (see chart for details).  History of rectal and vaginal bleeding.  I doubt bleeding is anywhere near as severe as patient is describing.  She has normal blood pressure and normal heart rate.  Labs show hemoglobin 11.3, which is a drop from 12.1 on October 15.  I strongly suspect that this is all uterine bleeding related to perimenopausal state.  Old records are reviewed confirming urgent care visit on October 15 at which time she had stated that her menses had started only a few days earlier, and also admitted to irregular menses with last prior menses in August.  Will need to do pelvic exam, and will also need to do rectal exam to see if she is having any gross rectal bleeding.  Pelvic examination demonstrates significant uterine bleeding.  No sign of dangerous amount of blood loss.  She is given a dose of medroxyprogesterone and discharged with prescription for  same.  Recommended follow-up with women's outpatient clinic in 1week.  Final Clinical Impressions(s) / ED Diagnoses   Final diagnoses:  Abnormal uterine bleeding (AUB)  Normochromic normocytic anemia    ED Discharge Orders         Ordered    medroxyPROGESTERone (PROVERA) 10 MG tablet  Daily     09/03/18 9379           Delora Fuel, MD  09/03/18 0640  

## 2019-11-28 ENCOUNTER — Other Ambulatory Visit: Payer: Self-pay

## 2019-11-28 ENCOUNTER — Encounter (HOSPITAL_COMMUNITY): Payer: Self-pay

## 2019-11-28 ENCOUNTER — Ambulatory Visit (HOSPITAL_COMMUNITY)
Admission: EM | Admit: 2019-11-28 | Discharge: 2019-11-28 | Disposition: A | Discharge status: Home / Self Care | Payer: BC Managed Care – PPO | Attending: Family Medicine | Admitting: Family Medicine

## 2019-11-28 ENCOUNTER — Telehealth (HOSPITAL_COMMUNITY): Payer: Self-pay

## 2019-11-28 DIAGNOSIS — E119 Type 2 diabetes mellitus without complications: Secondary | ICD-10-CM

## 2019-11-28 DIAGNOSIS — R252 Cramp and spasm: Secondary | ICD-10-CM | POA: Diagnosis not present

## 2019-11-28 DIAGNOSIS — R739 Hyperglycemia, unspecified: Secondary | ICD-10-CM

## 2019-11-28 DIAGNOSIS — E1165 Type 2 diabetes mellitus with hyperglycemia: Secondary | ICD-10-CM

## 2019-11-28 LAB — BASIC METABOLIC PANEL
Anion gap: 15 (ref 5–15)
BUN: 13 mg/dL (ref 6–20)
CO2: 18 mmol/L — ABNORMAL LOW (ref 22–32)
Calcium: 9.4 mg/dL (ref 8.9–10.3)
Chloride: 95 mmol/L — ABNORMAL LOW (ref 98–111)
Creatinine, Ser: 0.95 mg/dL (ref 0.44–1.00)
GFR calc Af Amer: >60 mL/min (ref >60)
GFR calc non Af Amer: >60 mL/min (ref >60)
Glucose, Bld: 624 mg/dL (ref 70–99)
Potassium: 4.7 mmol/L (ref 3.5–5.1)
Sodium: 128 mmol/L — ABNORMAL LOW (ref 135–145)

## 2019-11-28 LAB — POCT URINALYSIS DIP (DEVICE)
Bilirubin Urine: NEGATIVE
Glucose, UA: 500 mg/dL — AB
Ketones, ur: 40 mg/dL — AB
Leukocytes,Ua: NEGATIVE
Nitrite: NEGATIVE
Protein, ur: NEGATIVE mg/dL
Specific Gravity, Urine: <=1.005 (ref 1.005–1.030)
Urobilinogen, UA: 0.2 mg/dL (ref 0.0–1.0)
pH: 5 (ref 5.0–8.0)

## 2019-11-28 LAB — GLUCOSE, CAPILLARY: Glucose-Capillary: 588 mg/dL (ref 70–99)

## 2019-11-28 LAB — CBG MONITORING, ED: Glucose-Capillary: 588 mg/dL (ref 70–99)

## 2019-11-28 MED ORDER — METFORMIN HCL 500 MG PO TABS: 500.0000 mg | ORAL_TABLET | Freq: Two times a day (BID) | ORAL | 0 refills | Status: DC

## 2019-11-28 MED ORDER — BASAGLAR KWIKPEN 100 UNIT/ML ~~LOC~~ SOPN: 10.0000 [IU] | PEN_INJECTOR | Freq: Every day | SUBCUTANEOUS | 0 refills | Status: DC

## 2019-11-28 MED ORDER — CYCLOBENZAPRINE HCL 5 MG PO TABS: 5.0000 mg | ORAL_TABLET | Freq: Every day | ORAL | 0 refills | Status: DC

## 2019-11-28 MED ORDER — BLOOD GLUCOSE MONITOR KIT: PACK | 0 refills | Status: DC

## 2019-11-28 NOTE — ED Triage Notes (Signed)
Pt states she has had cramps in her legs and thighs for several weeks off and on.

## 2019-11-28 NOTE — Discharge Instructions (Addendum)
Please try using flexeril prior to bed time Continue to drink plenty of fluids Your blood sugar was significantly elevated, and based off this you have Diabetes Begin metformin twice daily Please use basaglar insulin daily to help bring your sugars down Monitor glucose at home **Please have pharmacist instruct and show you how to use this medicine and glucose monitoring kit**  Please establish care with primary care-contacts below, follow-up in 1 to 2 weeks, if you are unable to get in follow-up here for sugar recheck

## 2019-11-28 NOTE — ED Notes (Signed)
CBG 588 mg/dL reported to [employer] PA

## 2019-11-28 NOTE — ED Provider Notes (Addendum)
[place]    CSN: [id number] Arrival date & time: 11/28/19  1226      History   Chief Complaint Chief Complaint  Patient presents with  . cramps    HPI Roberta Jenkins is a 59 y.o. female no significant past medical history presenting today for evaluation of feet and leg cramps.  Patient states that over the past several weeks she is had cramping sensations in her feet and legs.  She notices this mainly in the morning when she wakes up.  Cramping sensation will last for approximately 15 minutes and then resolved.  She denies any fall injury or trauma.  Symptoms will migrate and vary between bilateral feet, calves and thighs.  She notes that she has varicose veins and is concerned if this could be the cause.  She denies history of diabetes.  Denies symptoms later in the day after waking up.  She notes that she drinks approximately 8 to 10 cups of water a day, frequently urinating approximately 20 times a day.  HPI  Past Medical History:  Diagnosis Date  . Back pain   . Menorrhagia 12/17/2011  . No pertinent past medical history     Patient Active Problem List   Diagnosis Date Noted  . Fibroid 09/04/2015  . Menorrhagia 12/17/2011    Past Surgical History:  Procedure Laterality Date  . [place]  . MULTIPLE TOOTH EXTRACTIONS      OB History    Gravida  4   Para  3   Term  3   Preterm      AB  1   Living  3     SAB  1   TAB      Ectopic      Multiple      Live Births               Home Medications    Prior to Admission medications   Medication Sig Start Date End Date Taking? Authorizing Provider  blood glucose meter kit and supplies KIT Dispense based on patient and insurance preference. Use up to four times daily as directed. (FOR ICD-9 250.00, 250.01). 11/28/19   Zay Yeargan C, PA-C  cyclobenzaprine (FLEXERIL) 5 MG tablet Take 1 tablet (5 mg total) by mouth at bedtime. 11/28/19   Ambria Mayfield C, PA-C    Insulin Glargine (BASAGLAR KWIKPEN) 100 UNIT/ML SOPN Inject 0.1 mLs (10 Units total) into the skin daily. 11/28/19 12/28/19  Abbott Jasinski C, PA-C  medroxyPROGESTERone (PROVERA) 10 MG tablet Take 1 tablet (10 mg total) by mouth daily. 12/09/23   Delora Fuel, MD  metFORMIN (GLUCOPHAGE) 500 MG tablet Take 1 tablet (500 mg total) by mouth 2 (two) times daily. 11/28/19 12/28/19  Jkwon Treptow, Elesa Hacker, PA-C    Family History Family History  Problem Relation Age of Onset  . Hypertension Mother   . Cancer Mother        colon  . Cancer Father   . Kidney disease Sister   . Hypertension Brother     Social History Social History   Tobacco Use  . Smoking status: Never Smoker  . Smokeless tobacco: Never Used  Substance Use Topics  . Alcohol use: No  . Drug use: No     Allergies   Patient has no known allergies.   Review of Systems Review of Systems  Constitutional: Negative for fatigue and fever.  Eyes: Negative for visual disturbance.  Respiratory: Negative for shortness of  breath.   Cardiovascular: Negative for chest pain.  Gastrointestinal: Negative for abdominal pain, nausea and vomiting.  Endocrine: Positive for polyphagia.  Genitourinary: Positive for frequency.  Musculoskeletal: Positive for myalgias. Negative for arthralgias and joint swelling.  Skin: Negative for color change, rash and wound.  Neurological: Negative for dizziness, weakness, light-headedness and headaches.     Physical Exam Triage Vital Signs ED Triage Vitals  Enc Vitals Group     BP 11/28/19 1323 116/71     Pulse Rate 11/28/19 1323 73     Resp 11/28/19 1323 18     Temp 11/28/19 1323 98.2 F (36.8 C)     Temp Source 11/28/19 1323 Oral     SpO2 11/28/19 1323 100 %     Weight 11/28/19 1321 160 lb (72.6 kg)     Height --      Head Circumference --      Peak Flow --      Pain Score 11/28/19 1321 8     Pain Loc --      Pain Edu? --      Excl. in [place]? --    No data found.  Updated Vital Signs BP  116/71 (BP Location: Right Arm)   Pulse 73   Temp 98.2 F (36.8 C) (Oral)   Resp 18   Wt 160 lb (72.6 kg)   LMP 08/09/2018 (Approximate)   SpO2 100%   BMI 24.33 kg/m   Visual Acuity Right Eye Distance:   Left Eye Distance:   Bilateral Distance:    Right Eye Near:   Left Eye Near:    Bilateral Near:     Physical Exam Vitals and nursing note reviewed.  Constitutional:      Appearance: She is well-developed.     Comments: No acute distress  HENT:     Head: Normocephalic and atraumatic.     Nose: Nose normal.  Eyes:     Conjunctiva/sclera: Conjunctivae normal.  Cardiovascular:     Rate and Rhythm: Normal rate.  Pulmonary:     Effort: Pulmonary effort is normal. No respiratory distress.  Abdominal:     General: There is no distension.  Musculoskeletal:        General: Normal range of motion.     Cervical back: Neck supple.     Comments: Bilateral lower leg symmetric, no calf swelling, erythema or tenderness to palpation of calves, nontender throughout bilateral ankles and dorsum of feet, dorsalis pedis 2+ bilaterally, sensation intact distally  Able to ambulate from chair to exam table without abnormality or assistance  Skin:    General: Skin is warm and dry.     Comments: Varicose veins noted to distal upper legs bilaterally  Neurological:     Mental Status: She is alert and oriented to person, place, and time.      UC Treatments / Results  Labs (all labs ordered are listed, but only abnormal results are displayed) Labs Reviewed  GLUCOSE, CAPILLARY - Abnormal; Notable for the following components:      Result Value   Glucose-Capillary 588 (*)    All other components within normal limits  POCT URINALYSIS DIP (DEVICE) - Abnormal; Notable for the following components:   Glucose, UA 500 (*)    Ketones, ur 40 (*)    Hgb urine dipstick TRACE (*)    All other components within normal limits  CBG MONITORING, ED - Abnormal; Notable for the following components:    Glucose-Capillary 588 (*)    All  other components within normal limits  BASIC METABOLIC PANEL    EKG   Radiology No results found.  Procedures Procedures (including critical care time)  Medications Ordered in UC Medications - No data to display  Initial Impression / Assessment and Plan / UC Course  I have reviewed the triage vital signs and the nursing notes.  Pertinent labs & imaging results that were available during my care of the patient were reviewed by me and considered in my medical decision making (see chart for details).  Clinical Course as of Nov 27 1509  Wed Nov 28, 2019  1442 Comment 1: Notify RN [HW]    Clinical Course User Index [HW] Kirston Luty C, PA-C    Fingerstick significantly elevated at 588, 40 ketones in urine, currently without nausea vomiting dizziness or lightheadedness, has had fatigue.  Do not suspect fulminant DKA at this time.  Initiating on Metformin 500 twice daily along with Basaglar 10 units daily, discussed plan with Dr. Mannie Stabile.  Will have follow-up with primary care for further monitoring and management of underlying diabetes.  Checking BMP to check electrolytes, likely contributing to cramping from dehydration.  Continue to push fluids.  Did provide Flexeril to try before bedtime to see if this at all helps with morning cramping.  Discussed strict return precautions. Patient verbalized understanding and is agreeable with plan.   BMP with elevated glucose, low Na, Cl, and CO2, labs consistent with trending towards DKA, if not already currently, main symptoms polyuria, polyphagia, and fatigue, no dizziness, lightheadedness, nausea or vomitng, attempted to call to inform of labs and recheck, no voicemail, unable to contact, will reattempt call tonight and in morning.    Final Clinical Impressions(s) / UC Diagnoses   Final diagnoses:  Muscle cramps  Elevated blood sugar  Type 2 diabetes mellitus without complication, without long-term  current use of insulin (HCC)     Discharge Instructions     Please try using flexeril prior to bed time Continue to drink plenty of fluids Your blood sugar was significantly elevated, and based off this you have Diabetes Begin metformin twice daily Please use basaglar insulin daily to help bring your sugars down Monitor glucose at home **Please have pharmacist instruct and show you how to use this medicine and glucose monitoring kit**  Please establish care with primary care-contacts below, follow-up in 1 to 2 weeks, if you are unable to get in follow-up here for sugar recheck   ED Prescriptions    Medication Sig Dispense Auth. Provider   cyclobenzaprine (FLEXERIL) 5 MG tablet Take 1 tablet (5 mg total) by mouth at bedtime. 20 tablet Kiva Norland C, PA-C   metFORMIN (GLUCOPHAGE) 500 MG tablet Take 1 tablet (500 mg total) by mouth 2 (two) times daily. 60 tablet Glenda Kunst C, PA-C   Insulin Glargine (BASAGLAR KWIKPEN) 100 UNIT/ML SOPN Inject 0.1 mLs (10 Units total) into the skin daily. 3 mL Rozelia Catapano C, PA-C   blood glucose meter kit and supplies KIT Dispense based on patient and insurance preference. Use up to four times daily as directed. (FOR ICD-9 250.00, 250.01). 1 each Tava Peery, Elesa Hacker, PA-C     PDMP not reviewed this encounter.   Joneen Caraway Turkey C, PA-C 11/28/19 1528    Janith Lima, PA-C 11/28/19 1803

## 2019-11-29 ENCOUNTER — Telehealth (HOSPITAL_COMMUNITY): Payer: Self-pay | Admitting: Emergency Medicine

## 2019-11-29 NOTE — Telephone Encounter (Signed)
Called to check on patient from visit yesterday and discuss lab work. Patient was able to obtain metformin and insulin and has been giving herself injections. Primary care follow up on 12/06/2019. Patient verbalized understanding to go to emergency room if she is feeling worse- fatigue, dizzy, lightheaded, nausea, vomiting.

## 2019-11-30 ENCOUNTER — Encounter (HOSPITAL_COMMUNITY): Payer: Self-pay

## 2019-11-30 ENCOUNTER — Emergency Department (HOSPITAL_COMMUNITY)
Admission: EM | Admit: 2019-11-30 | Discharge: 2019-11-30 | Disposition: A | Discharge status: Home / Self Care | Payer: BC Managed Care – PPO | Attending: Emergency Medicine | Admitting: Emergency Medicine

## 2019-11-30 ENCOUNTER — Other Ambulatory Visit: Payer: Self-pay

## 2019-11-30 DIAGNOSIS — Z794 Long term (current) use of insulin: Secondary | ICD-10-CM | POA: Insufficient documentation

## 2019-11-30 DIAGNOSIS — Z79899 Other long term (current) drug therapy: Secondary | ICD-10-CM | POA: Diagnosis not present

## 2019-11-30 DIAGNOSIS — E1165 Type 2 diabetes mellitus with hyperglycemia: Secondary | ICD-10-CM | POA: Insufficient documentation

## 2019-11-30 DIAGNOSIS — R739 Hyperglycemia, unspecified: Secondary | ICD-10-CM

## 2019-11-30 DIAGNOSIS — E119 Type 2 diabetes mellitus without complications: Secondary | ICD-10-CM

## 2019-11-30 DIAGNOSIS — R252 Cramp and spasm: Secondary | ICD-10-CM | POA: Diagnosis present

## 2019-11-30 HISTORY — DX: Type 2 diabetes mellitus without complications: E11.9

## 2019-11-30 LAB — BASIC METABOLIC PANEL
Anion gap: 13 (ref 5–15)
BUN: 12 mg/dL (ref 6–20)
CO2: 21 mmol/L — ABNORMAL LOW (ref 22–32)
Calcium: 9.2 mg/dL (ref 8.9–10.3)
Chloride: 96 mmol/L — ABNORMAL LOW (ref 98–111)
Creatinine, Ser: 0.77 mg/dL (ref 0.44–1.00)
GFR calc Af Amer: >60 mL/min (ref >60)
GFR calc non Af Amer: >60 mL/min (ref >60)
Glucose, Bld: 369 mg/dL — ABNORMAL HIGH (ref 70–99)
Potassium: 4 mmol/L (ref 3.5–5.1)
Sodium: 130 mmol/L — ABNORMAL LOW (ref 135–145)

## 2019-11-30 LAB — CBC WITH DIFFERENTIAL/PLATELET
Abs Immature Granulocytes: 0.01 10*3/uL (ref 0.00–0.07)
Basophils Absolute: 0.1 10*3/uL (ref 0.0–0.1)
Basophils Relative: 1 %
Eosinophils Absolute: 0.1 10*3/uL (ref 0.0–0.5)
Eosinophils Relative: 2 %
HCT: 43.1 % (ref 36.0–46.0)
Hemoglobin: 14 g/dL (ref 12.0–15.0)
Immature Granulocytes: 0 %
Lymphocytes Relative: 59 %
Lymphs Abs: 3.2 10*3/uL (ref 0.7–4.0)
MCH: 27.7 pg (ref 26.0–34.0)
MCHC: 32.5 g/dL (ref 30.0–36.0)
MCV: 85.2 fL (ref 80.0–100.0)
Monocytes Absolute: 0.2 10*3/uL (ref 0.1–1.0)
Monocytes Relative: 4 %
Neutro Abs: 1.8 10*3/uL (ref 1.7–7.7)
Neutrophils Relative %: 34 %
Platelets: 255 10*3/uL (ref 150–400)
RBC: 5.06 MIL/uL (ref 3.87–5.11)
RDW: 11.7 % (ref 11.5–15.5)
WBC: 5.4 10*3/uL (ref 4.0–10.5)
nRBC: 0 % (ref 0.0–0.2)

## 2019-11-30 LAB — BLOOD GAS, VENOUS
Acid-base deficit: 2.8 mmol/L — ABNORMAL HIGH (ref 0.0–2.0)
Bicarbonate: 21.5 mmol/L (ref 20.0–28.0)
FIO2: 21
O2 Saturation: 88.8 %
Patient temperature: 98.6
pCO2, Ven: 37.9 mmHg — ABNORMAL LOW (ref 44.0–60.0)
pH, Ven: 7.373 (ref 7.250–7.430)
pO2, Ven: 59.3 mmHg — ABNORMAL HIGH (ref 32.0–45.0)

## 2019-11-30 LAB — URINALYSIS, ROUTINE W REFLEX MICROSCOPIC
Bacteria, UA: NONE SEEN
Bilirubin Urine: NEGATIVE
Glucose, UA: >=500 mg/dL — AB
Hgb urine dipstick: NEGATIVE
Ketones, ur: 80 mg/dL — AB
Leukocytes,Ua: NEGATIVE
Nitrite: NEGATIVE
Protein, ur: NEGATIVE mg/dL
Specific Gravity, Urine: 1.032 — ABNORMAL HIGH (ref 1.005–1.030)
pH: 5 (ref 5.0–8.0)

## 2019-11-30 LAB — CBG MONITORING, ED
Glucose-Capillary: 360 mg/dL — ABNORMAL HIGH (ref 70–99)
Glucose-Capillary: 187 mg/dL — ABNORMAL HIGH (ref 70–99)

## 2019-11-30 MED ORDER — INSULIN ASPART 100 UNIT/ML ~~LOC~~ SOLN
10.0000 [IU] | Freq: Once | SUBCUTANEOUS | Status: AC
  Administered 2019-11-30: 10 [IU] via SUBCUTANEOUS
  Filled 2019-11-30: qty 0.1

## 2019-11-30 MED ORDER — SODIUM CHLORIDE 0.9 % IV BOLUS
1000.0000 mL | Freq: Once | INTRAVENOUS | Status: AC
  Administered 2019-11-30: 06:00:00 1000 mL via INTRAVENOUS

## 2019-11-30 MED ORDER — SODIUM CHLORIDE 0.9 % IV BOLUS
1000.0000 mL | Freq: Once | INTRAVENOUS | Status: AC
  Administered 2019-11-30: 1000 mL via INTRAVENOUS

## 2019-11-30 MED ORDER — BASAGLAR KWIKPEN 100 UNIT/ML ~~LOC~~ SOPN: 18.0000 [IU] | PEN_INJECTOR | Freq: Every day | SUBCUTANEOUS | 0 refills | Status: DC

## 2019-11-30 NOTE — ED Notes (Signed)
Discharge instructions reviewed. Diabetes coordinator is coming to speak with patient prior to patient leaving the department.

## 2019-11-30 NOTE — ED Provider Notes (Signed)
59 year old female received an handoff from Dr. Florina Ou.  Patient had new onset of diabetes diagnosed 3 days ago at [facility] urgent care.  She was started on Metformin and insulin.  She has continued to have some generalized weakness, polyuria, and polydipsia.  She presented here with blood sugars in the 300s.  She has received IV fluids and insulin.  She does not appear to be in DKA.  Plan recheck CBG and probable discharge to home Physical Exam  BP 117/80 (BP Location: Right Arm)   Pulse 70   Temp 98.9 F (37.2 C) (Oral)   Resp 18   LMP 08/09/2018 (Approximate)   SpO2 98%   Physical Exam  ED Course/Procedures     Procedures  MDM   Blood sugar now 187 Patient advised regarding need for follow-up and diabetes education as well as diet.  Appears stable for discharge     Pattricia Boss, MD 11/30/19 1016

## 2019-11-30 NOTE — ED Notes (Signed)
Patient ambulated to restroom without assistance. Gait steady. 

## 2019-11-30 NOTE — Progress Notes (Signed)
Inpatient Diabetes Program Recommendations  AACE/ADA: New Consensus Statement on Inpatient Glycemic Control (2015)  Target Ranges:  Prepandial:   less than 140 mg/dL      Peak postprandial:   less than 180 mg/dL (1-2 hours)      Critically ill patients:  140 - 180 mg/dL   Lab Results  Component Value Date   GLUCAP 187 (H) 11/30/2019    Spoke with patient regarding Glucose levels and management at home.   Discussed when to check glucose Detailed discussion regarding diet, carbohydrates, portion sizes, beverage options. Pt reported error message on glucose meter at home. Pt to take back to [employer] today to exchange. Discussed exercise Pt has hospital follow up to establish care on Tuesday next week.  Pt to call new PCP if glucose > 300. Or go to urgent care for adjustments Discussed hypoglycemia and treatment   Pt had many questions about diet. Pt has no questions at this time. Pt to take her basal insulin once home today.  Spoke with Dr. Jeanell Sparrow regarding Basaglar dose at home. MD increased to 18 units.  Thanks, Tama Headings RN, MSN, BC-ADM Inpatient Diabetes Coordinator Team Pager [phone] (8a-5p)

## 2019-11-30 NOTE — ED Provider Notes (Signed)
[facility] Provider Note: Georgena Spurling, MD, FACEP  CSN: [id number] MRN: [id number] ARRIVAL: 11/30/19 at [place]: [place]  Leg Pain   HISTORY OF PRESENT ILLNESS  11/30/19 6:25 AM Roberta Jenkins is a 59 y.o. female who has had several weeks of generalized weakness, polyuria, polydipsia and blurred vision.  She has also had cramps in her legs and feet which she rates as a 10 out of 10 at its worst.  The pain is worse in the morning and eases up throughout the day.  She was seen at the [facility] two days ago and diagnosed with new onset diabetes (sugar over 600) but was not felt to be in diabetic ketoacidosis.  She was started on Metformin 500 mg twice daily and insulin glargine 10 units subcu daily.  She has taken these medications for the past 2 days.  She was also given Flexeril 5 mg at bedtime for her leg pain but states her pain is not significantly improved.  CBG was noted to be 360 on arrival here.   Past Medical History:  Diagnosis Date  . Back pain   . Diabetes mellitus ([place])   . Menorrhagia 12/17/2011  . No pertinent past medical history     Past Surgical History:  Procedure Laterality Date  . [place]  . MULTIPLE TOOTH EXTRACTIONS      Family History  Problem Relation Age of Onset  . Hypertension Mother   . Cancer Mother        colon  . Cancer Father   . Kidney disease Sister   . Hypertension Brother     Social History   Tobacco Use  . Smoking status: Never Smoker  . Smokeless tobacco: Never Used  Substance Use Topics  . Alcohol use: No  . Drug use: No    Prior to Admission medications   Medication Sig Start Date End Date Taking? Authorizing Provider  cyclobenzaprine (FLEXERIL) 5 MG tablet Take 1 tablet (5 mg total) by mouth at bedtime. 11/28/19  Yes Wieters, Hallie C, PA-C  metFORMIN (GLUCOPHAGE) 500 MG tablet Take 1 tablet (500 mg total) by mouth 2 (two) times daily. 11/28/19 12/28/19  Yes Wieters, Hallie C, PA-C  Multiple Vitamin (MULTIVITAMIN WITH MINERALS) TABS tablet Take 1 tablet by mouth daily.   Yes [provider]  blood glucose meter kit and supplies KIT Dispense based on patient and insurance preference. Use up to four times daily as directed. (FOR ICD-9 250.00, 250.01). 11/28/19   Wieters, Hallie C, PA-C  Insulin Glargine (BASAGLAR KWIKPEN) 100 UNIT/ML SOPN Inject 0.18 mLs (18 Units total) into the skin daily. 11/30/19 12/30/19  Pattricia Boss, MD  medroxyPROGESTERone (PROVERA) 10 MG tablet Take 1 tablet (10 mg total) by mouth daily. Patient not taking: Reported on 9/56/2130 86/5/78   Delora Fuel, MD    Allergies Patient has no known allergies.   REVIEW OF SYSTEMS  Negative except as noted here or in the History of Present Illness.   PHYSICAL EXAMINATION  Initial Vital Signs Blood pressure 119/88, pulse 86, temperature 98.9 F (37.2 C), temperature source Oral, resp. rate 14, last menstrual period 08/09/2018, SpO2 100 %.  Examination General: Well-developed, well-nourished female in no acute distress; appearance consistent with age of record HENT: normocephalic; atraumatic Eyes: pupils equal, round and reactive to light; extraocular muscles intact Neck: supple Heart: regular rate and rhythm Lungs: clear to auscultation bilaterally Abdomen: soft; nondistended; nontender; bowel sounds  present Extremities: No deformity; full range of motion; pulses normal; no edema or tenderness of lower extremities Neurologic: Awake, alert and oriented; motor function intact in all extremities and symmetric; no facial droop Skin: Warm and dry; no rash seen Psychiatric: Normal mood and affect   RESULTS  Summary of this visit's results, reviewed and interpreted by myself:   EKG Interpretation  Date/Time:    Ventricular Rate:    PR Interval:    QRS Duration:   QT Interval:    QTC Calculation:   R Axis:     Text Interpretation:        Laboratory  Studies: Results for orders placed or performed during the hospital encounter of 11/30/19 (from the past 24 hour(s))  CBG monitoring, ED     Status: Abnormal   Collection Time: 11/30/19  6:15 AM  Result Value Ref Range   Glucose-Capillary 360 (H) 70 - 99 mg/dL  CBC with Differential/Platelet     Status: None   Collection Time: 11/30/19  6:21 AM  Result Value Ref Range   WBC 5.4 4.0 - 10.5 K/uL   RBC 5.06 3.87 - 5.11 MIL/uL   Hemoglobin 14.0 12.0 - 15.0 g/dL   HCT 43.1 36.0 - 46.0 %   MCV 85.2 80.0 - 100.0 fL   MCH 27.7 26.0 - 34.0 pg   MCHC 32.5 30.0 - 36.0 g/dL   RDW 11.7 11.5 - 15.5 %   Platelets 255 150 - 400 K/uL   nRBC 0.0 0.0 - 0.2 %   Neutrophils Relative % 34 %   Neutro Abs 1.8 1.7 - 7.7 K/uL   Lymphocytes Relative 59 %   Lymphs Abs 3.2 0.7 - 4.0 K/uL   Monocytes Relative 4 %   Monocytes Absolute 0.2 0.1 - 1.0 K/uL   Eosinophils Relative 2 %   Eosinophils Absolute 0.1 0.0 - 0.5 K/uL   Basophils Relative 1 %   Basophils Absolute 0.1 0.0 - 0.1 K/uL   Immature Granulocytes 0 %   Abs Immature Granulocytes 0.01 0.00 - 0.07 K/uL  Basic metabolic panel     Status: Abnormal   Collection Time: 11/30/19  6:21 AM  Result Value Ref Range   Sodium 130 (L) 135 - 145 mmol/L   Potassium 4.0 3.5 - 5.1 mmol/L   Chloride 96 (L) 98 - 111 mmol/L   CO2 21 (L) 22 - 32 mmol/L   Glucose, Bld 369 (H) 70 - 99 mg/dL   BUN 12 6 - 20 mg/dL   Creatinine, Ser 0.77 0.44 - 1.00 mg/dL   Calcium 9.2 8.9 - 10.3 mg/dL   GFR calc non Af Amer >60 >60 mL/min   GFR calc Af Amer >60 >60 mL/min   Anion gap 13 5 - 15  Blood gas, venous     Status: Abnormal   Collection Time: 11/30/19  6:21 AM  Result Value Ref Range   FIO2 21.00    pH, Ven 7.373 7.250 - 7.430   pCO2, Ven 37.9 (L) 44.0 - 60.0 mmHg   pO2, Ven 59.3 (H) 32.0 - 45.0 mmHg   Bicarbonate 21.5 20.0 - 28.0 mmol/L   Acid-base deficit 2.8 (H) 0.0 - 2.0 mmol/L   O2 Saturation 88.8 %   Patient temperature 98.6    Sample type VENOUS     Urinalysis, Routine w reflex microscopic     Status: Abnormal   Collection Time: 11/30/19  8:06 AM  Result Value Ref Range   Color, Urine YELLOW YELLOW  APPearance CLEAR CLEAR   Specific Gravity, Urine 1.032 (H) 1.005 - 1.030   pH 5.0 5.0 - 8.0   Glucose, UA >=500 (A) NEGATIVE mg/dL   Hgb urine dipstick NEGATIVE NEGATIVE   Bilirubin Urine NEGATIVE NEGATIVE   Ketones, ur 80 (A) NEGATIVE mg/dL   Protein, ur NEGATIVE NEGATIVE mg/dL   Nitrite NEGATIVE NEGATIVE   Leukocytes,Ua NEGATIVE NEGATIVE   RBC / HPF 0-5 0 - 5 RBC/hpf   WBC, UA 0-5 0 - 5 WBC/hpf   Bacteria, UA NONE SEEN NONE SEEN   Squamous Epithelial / LPF 0-5 0 - 5  CBG monitoring, ED     Status: Abnormal   Collection Time: 11/30/19  9:21 AM  Result Value Ref Range   Glucose-Capillary 187 (H) 70 - 99 mg/dL   Imaging Studies: No results found.  ED COURSE and MDM  Nursing notes, initial and subsequent vitals signs, including pulse oximetry, reviewed and interpreted by myself.  Vitals:   11/30/19 0601 11/30/19 0630 11/30/19 0822 11/30/19 1029  BP: 134/84 108/74 117/80 122/82  Pulse: 98 73 70 71  Resp: '16 14 18 18  '$ Temp:      TempSrc:      SpO2: 100% 99% 98% 100%   Medications  sodium chloride 0.9 % bolus 1,000 mL (has no administration in time range)  sodium chloride 0.9 % bolus 1,000 mL (1,000 mLs Intravenous New Bag/Given 11/30/19 0620)  insulin aspart (novoLOG) injection 10 Units (10 Units Subcutaneous Given 11/30/19 0634)   6:30 AM IV fluid bolus started and 10 mg of regular insulin subcu given.  7:08 AM Signed out to Dr. Jeanell Sparrow.   PROCEDURES  Procedures   ED DIAGNOSES     ICD-10-CM   1. Diabetes mellitus, new onset ([place])  E11.9   2. Hyperglycemia  R73.9        Lekeisha Arenas, Jenny Reichmann, MD 11/30/19 2231

## 2019-11-30 NOTE — Discharge Instructions (Addendum)
Please modify your diet as discussed Take all medications as prescribed Recheck with your doctor next week

## 2019-11-30 NOTE — ED Triage Notes (Signed)
Pt reports bilateral cramps in her legs and feet for the last few weeks. She states that the pain is the worst in the morning, and eases up a little after she gets going. She was seen at urgent care for same and states that the muscle relaxers that she was given only helped a little. Ambulatory.

## 2019-12-05 NOTE — Progress Notes (Deleted)
Patient ID: Roberta Jenkins, female   DOB: [birth year], 59 y.o.   MRN: [id number] After being diagnosed with DM at [facility] then seen at ED 11/30/2019.  From ED note: [name] is a 59 y.o. female who has had several weeks of generalized weakness, polyuria, polydipsia and blurred vision.  She has also had cramps in her legs and feet which she rates as a 10 out of 10 at its worst.  The pain is worse in the morning and eases up throughout the day.  She was seen at the [facility] two days ago and diagnosed with new onset diabetes (sugar over 600) but was not felt to be in diabetic ketoacidosis.  She was started on Metformin 500 mg twice daily and insulin glargine 10 units subcu daily.  She has taken these medications for the past 2 days.  She was also given Flexeril 5 mg at bedtime for her leg pain but states her pain is not significantly improved.  CBG was noted to be 360 on arrival here.

## 2019-12-06 ENCOUNTER — Inpatient Hospital Stay: Payer: BC Managed Care – PPO

## 2019-12-17 NOTE — Progress Notes (Signed)
Patient ID: Roberta Jenkins, female   DOB: 1961-11-01, 59 y.o.   MRN: [id number]    Roberta Jenkins, is a 59 y.o. female  HKV:[id number]  FIE:[id number]  DOB - 22-Feb-1961  Subjective:  Chief Complaint and HPI: Roberta Jenkins is a 59 y.o. female here today to establish care and for a follow up visit After ED visit 11/30/2019.  She is very newly diagnosed with diabetes.  She was started on basaglar 18 units daily and metformin 500 bid.  She is compliant.  Blood sugars running from 90-160.  She has not had any hypoglycemic episodes.  She is feeling a little down about "not being able to eat like everyone else anymore."  She denies SI/HI.  She was able to get her prescriptions and a glucometer  R knee pain X 2 weeks with some swelling.  Worse with weight bearing.  No f/c.  No redness.  NKI.    From ED note: Roberta Jenkins is a 59 y.o. female who has had several weeks of generalized weakness, polyuria, polydipsia and blurred vision.  She has also had cramps in her legs and feet which she rates as a 10 out of 10 at its worst.  The pain is worse in the morning and eases up throughout the day.  She was seen at the [facility] Urgent Care two days ago and diagnosed with new onset diabetes (sugar over 600) but was not felt to be in diabetic ketoacidosis.  She was started on Metformin 500 mg twice daily and insulin glargine 10 units subcu daily.  She has taken these medications for the past 2 days.  She was also given Flexeril 5 mg at bedtime for her leg pain but states her pain is not significantly improved.  CBG was noted to be 360 on arrival here  Depression screen [facility] 2/9 12/19/2019  Decreased Interest 0  Down, Depressed, Hopeless 2  PHQ - 2 Score 2  Altered sleeping 2  Tired, decreased energy 3  Change in appetite 3  Feeling bad or failure about yourself  3  Trouble concentrating 2  Moving slowly or fidgety/restless 3  Suicidal thoughts 1  PHQ-9 Score 19   ED/Hospital notes reviewed.     Family history:  diabetes ROS:   Constitutional:  No f/c, No night sweats, No unexplained weight loss. EENT:  No vision changes, No blurry vision, No hearing changes. No mouth, throat, or ear problems.  Respiratory: No cough, No SOB Cardiac: No CP, no palpitations GI:  No abd pain, No N/V/D. GU: No Urinary s/sx Musculoskeletal: +R knee pain Neuro: No headache, no dizziness, no motor weakness.  Skin: No rash Endocrine:  No polydipsia. No polyuria.  Psych: Denies SI/HI  No problems updated.  ALLERGIES: No Known Allergies  PAST MEDICAL HISTORY: Past Medical History:  Diagnosis Date  . Back pain   . Diabetes mellitus ([place])   . Menorrhagia 12/17/2011  . No pertinent past medical history     MEDICATIONS AT HOME: Prior to Admission medications   Medication Sig Start Date End Date Taking? Authorizing Provider  blood glucose meter kit and supplies KIT Dispense based on patient and insurance preference. Use up to four times daily as directed. (FOR ICD-9 250.00, 250.01). 11/28/19  Yes Wieters, Hallie C, PA-C  cyclobenzaprine (FLEXERIL) 5 MG tablet Take 1 tablet (5 mg total) by mouth at bedtime. 11/28/19  Yes Wieters, Hallie C, PA-C  Insulin Glargine (BASAGLAR KWIKPEN) 100 UNIT/ML SOPN Inject 0.18 mLs (18 Units total)  into the skin daily. 12/19/19 01/18/20 Yes Shelda Truby, Dionne Bucy, PA-C  metFORMIN (GLUCOPHAGE) 500 MG tablet Take 1 tablet (500 mg total) by mouth 2 (two) times daily. 12/19/19 01/18/20 Yes Verenis Nicosia, Dionne Bucy, PA-C  Multiple Vitamin (MULTIVITAMIN WITH MINERALS) TABS tablet Take 1 tablet by mouth daily.   Yes [provider]  celecoxib (CELEBREX) 200 MG capsule Take 1 capsule (200 mg total) by mouth 2 (two) times daily. X 1 week then one daily prn pain 12/19/19   Argentina Donovan, PA-C  medroxyPROGESTERone (PROVERA) 10 MG tablet Take 1 tablet (10 mg total) by mouth daily. Patient not taking: Reported on 02/07/8118 14/7/82   Delora Fuel, MD     Objective:  EXAM:    Vitals:   12/19/19 0909  BP: 109/72  Pulse: 79  Resp: 16  Temp: 97.9 F (36.6 C)  SpO2: 100%  Weight: 177 lb (80.3 kg)  Height: '5\' 8"'$  (1.727 m)    General appearance : A&OX3. NAD. Non-toxic-appearing HEENT: Atraumatic and Normocephalic.  PERRLA. EOM intact.  Chest/Lungs:  Breathing-non-labored, Good air entry bilaterally, breath sounds normal without rales, rhonchi, or wheezing  CVS: S1 S2 regular, no murmurs, gallops, rubs  Extremities: Bilateral Lower Ext shows no edema, both legs are warm to touch with = pulse throughout.  R knee-skin is warm dry and intact.  There is no ballotment.  Mild swelling superior to patella on the R.  ROM ~80% of normal.  Ligaments stable.  Neurology:  CN II-XII grossly intact, Non focal.   Psych:  TP linear. J/I WNL. Normal speech. Appropriate eye contact and affect.  Skin:  No Rash  Data Review Lab Results  Component Value Date   HGBA1C 8.3 (A) 12/19/2019     Assessment & Plan   1. Type 2 diabetes mellitus with hyperglycemia, unspecified whether long term insulin use (HCC) Improving significantly.  Check blood sugars bid and assess in 3 weeks.  Consider ACE at very low dose once labs are back - POCT glycosylated hemoglobin (Hb A1C) - Glucose (CBG) - metFORMIN (GLUCOPHAGE) 500 MG tablet; Take 1 tablet (500 mg total) by mouth 2 (two) times daily.  Dispense: 60 tablet; Refill: 3 - Insulin Glargine (BASAGLAR KWIKPEN) 100 UNIT/ML SOPN; Inject 0.18 mLs (18 Units total) into the skin daily.  Dispense: 5.4 mL; Refill: 3 - Comprehensive metabolic panel - Lipid panel  2. Grief reaction Due to diet changes and newly diagnosed disease process - Ambulatory referral to Social Work  3. Recurrent pain of right knee - celecoxib (CELEBREX) 200 MG capsule; Take 1 capsule (200 mg total) by mouth 2 (two) times daily. X 1 week then one daily prn pain  Dispense: 60 capsule; Refill: 1 - Ambulatory referral to Orthopedic Surgery  4. Encounter for examination  following treatment at hospital improving     Patient have been counseled extensively about nutrition and exercise  Return in about 3 weeks (around 01/09/2020) for televist with Lurena Joiner to assess glycemic control.  The patient was given clear instructions to go to ER or return to medical center if symptoms don't improve, worsen or new problems develop. The patient verbalized understanding. The patient was told to call to get lab results if they haven't heard anything in the next week.     Freeman Caldron, PA-C [facility] and [facility] [place], [place]   12/19/2019, 10:01 AM

## 2019-12-19 ENCOUNTER — Other Ambulatory Visit: Payer: Self-pay

## 2019-12-19 ENCOUNTER — Ambulatory Visit: Payer: BC Managed Care – PPO | Attending: Family Medicine | Admitting: Physician Assistant

## 2019-12-19 VITALS — BP 109/72 | HR 79 | Temp 97.9°F | Resp 16 | Ht 68.0 in | Wt 177.0 lb

## 2019-12-19 DIAGNOSIS — Z09 Encounter for follow-up examination after completed treatment for conditions other than malignant neoplasm: Secondary | ICD-10-CM

## 2019-12-19 DIAGNOSIS — E1165 Type 2 diabetes mellitus with hyperglycemia: Secondary | ICD-10-CM | POA: Diagnosis not present

## 2019-12-19 DIAGNOSIS — M25561 Pain in right knee: Secondary | ICD-10-CM | POA: Diagnosis not present

## 2019-12-19 DIAGNOSIS — F4321 Adjustment disorder with depressed mood: Secondary | ICD-10-CM | POA: Diagnosis not present

## 2019-12-19 LAB — POCT GLYCOSYLATED HEMOGLOBIN (HGB A1C): Hemoglobin A1C: 8.3 % — AB (ref 4.0–5.6)

## 2019-12-19 LAB — GLUCOSE, POCT (MANUAL RESULT ENTRY): POC Glucose: 121 mg/dl — AB (ref 70–99)

## 2019-12-19 MED ORDER — BASAGLAR KWIKPEN 100 UNIT/ML ~~LOC~~ SOPN: 18.0000 [IU] | PEN_INJECTOR | Freq: Every day | SUBCUTANEOUS | 3 refills | Status: DC

## 2019-12-19 MED ORDER — CELECOXIB 200 MG PO CAPS: 200.0000 mg | ORAL_CAPSULE | Freq: Two times a day (BID) | ORAL | 1 refills | Status: DC

## 2019-12-19 MED ORDER — METFORMIN HCL 500 MG PO TABS: 500.0000 mg | ORAL_TABLET | Freq: Two times a day (BID) | ORAL | 3 refills | Status: DC

## 2019-12-19 NOTE — Progress Notes (Signed)
Hospital f /u   R knee pain when walking X 2 wk/ Pain 10/10. Denies any injury to the knee  CBG 121  A1C 8.3

## 2019-12-19 NOTE — Patient Instructions (Signed)
Check blood sugars twice daily and record.

## 2019-12-20 ENCOUNTER — Other Ambulatory Visit: Payer: Self-pay | Admitting: Physician Assistant

## 2019-12-20 DIAGNOSIS — E785 Hyperlipidemia, unspecified: Secondary | ICD-10-CM

## 2019-12-20 LAB — COMPREHENSIVE METABOLIC PANEL
ALT: 17 IU/L (ref 0–32)
AST: 18 IU/L (ref 0–40)
Albumin/Globulin Ratio: 1.6 (ref 1.2–2.2)
Albumin: 4.2 g/dL (ref 3.8–4.9)
Alkaline Phosphatase: 54 IU/L (ref 39–117)
BUN/Creatinine Ratio: 11 — ABNORMAL LOW (ref 12–28)
BUN: 9 mg/dL (ref 8–27)
Bilirubin Total: 0.4 mg/dL (ref 0.0–1.2)
CO2: 20 mmol/L (ref 20–29)
Calcium: 9.7 mg/dL (ref 8.7–10.3)
Chloride: 105 mmol/L (ref 96–106)
Creatinine, Ser: 0.79 mg/dL (ref 0.57–1.00)
GFR calc Af Amer: 94 mL/min/{1.73_m2} (ref >59)
GFR calc non Af Amer: 82 mL/min/{1.73_m2} (ref >59)
Globulin, Total: 2.6 g/dL (ref 1.5–4.5)
Glucose: 71 mg/dL (ref 65–99)
Potassium: 4.5 mmol/L (ref 3.5–5.2)
Sodium: 142 mmol/L (ref 134–144)
Total Protein: 6.8 g/dL (ref 6.0–8.5)

## 2019-12-20 LAB — LIPID PANEL
Chol/HDL Ratio: 4.7 ratio — ABNORMAL HIGH (ref 0.0–4.4)
Cholesterol, Total: 241 mg/dL — ABNORMAL HIGH (ref 100–199)
HDL: 51 mg/dL (ref >39)
LDL Chol Calc (NIH): 164 mg/dL — ABNORMAL HIGH (ref 0–99)
Triglycerides: 142 mg/dL (ref 0–149)
VLDL Cholesterol Cal: 26 mg/dL (ref 5–40)

## 2019-12-20 MED ORDER — ATORVASTATIN CALCIUM 20 MG PO TABS: 20.0000 mg | ORAL_TABLET | Freq: Every day | ORAL | 3 refills | Status: DC

## 2019-12-21 ENCOUNTER — Telehealth: Payer: Self-pay | Admitting: Licensed Clinical Social Worker

## 2019-12-21 NOTE — Telephone Encounter (Signed)
This MSW Intern placed call to patient to follow up regarding referral to social work placed by [facility] 12/19/19. Patient did not answer, unable to leave voicemail.

## 2019-12-23 ENCOUNTER — Encounter (HOSPITAL_COMMUNITY): Payer: Self-pay

## 2019-12-23 ENCOUNTER — Other Ambulatory Visit: Payer: Self-pay

## 2019-12-23 ENCOUNTER — Emergency Department (HOSPITAL_COMMUNITY)
Admission: EM | Admit: 2019-12-23 | Discharge: 2019-12-23 | Disposition: A | Discharge status: Home / Self Care | Payer: BC Managed Care – PPO | Attending: Emergency Medicine | Admitting: Emergency Medicine

## 2019-12-23 DIAGNOSIS — R600 Localized edema: Secondary | ICD-10-CM | POA: Diagnosis not present

## 2019-12-23 DIAGNOSIS — Z20822 Contact with and (suspected) exposure to covid-19: Secondary | ICD-10-CM | POA: Insufficient documentation

## 2019-12-23 DIAGNOSIS — D649 Anemia, unspecified: Secondary | ICD-10-CM | POA: Diagnosis not present

## 2019-12-23 DIAGNOSIS — M25561 Pain in right knee: Secondary | ICD-10-CM | POA: Diagnosis not present

## 2019-12-23 DIAGNOSIS — E119 Type 2 diabetes mellitus without complications: Secondary | ICD-10-CM | POA: Diagnosis not present

## 2019-12-23 DIAGNOSIS — Z79899 Other long term (current) drug therapy: Secondary | ICD-10-CM | POA: Diagnosis not present

## 2019-12-23 LAB — COMPREHENSIVE METABOLIC PANEL
ALT: 17 U/L (ref 0–44)
AST: 18 U/L (ref 15–41)
Albumin: 3.9 g/dL (ref 3.5–5.0)
Alkaline Phosphatase: 43 U/L (ref 38–126)
Anion gap: 8 (ref 5–15)
BUN: 9 mg/dL (ref 6–20)
CO2: 26 mmol/L (ref 22–32)
Calcium: 9.2 mg/dL (ref 8.9–10.3)
Chloride: 107 mmol/L (ref 98–111)
Creatinine, Ser: 0.71 mg/dL (ref 0.44–1.00)
GFR calc Af Amer: >60 mL/min (ref >60)
GFR calc non Af Amer: >60 mL/min (ref >60)
Glucose, Bld: 129 mg/dL — ABNORMAL HIGH (ref 70–99)
Potassium: 3.6 mmol/L (ref 3.5–5.1)
Sodium: 141 mmol/L (ref 135–145)
Total Bilirubin: 0.6 mg/dL (ref 0.3–1.2)
Total Protein: 7.1 g/dL (ref 6.5–8.1)

## 2019-12-23 LAB — CBC WITH DIFFERENTIAL/PLATELET
Abs Immature Granulocytes: 0.02 10*3/uL (ref 0.00–0.07)
Basophils Absolute: 0 10*3/uL (ref 0.0–0.1)
Basophils Relative: 1 %
Eosinophils Absolute: 0 10*3/uL (ref 0.0–0.5)
Eosinophils Relative: 1 %
HCT: 34.1 % — ABNORMAL LOW (ref 36.0–46.0)
Hemoglobin: 10.3 g/dL — ABNORMAL LOW (ref 12.0–15.0)
Immature Granulocytes: 1 %
Lymphocytes Relative: 28 %
Lymphs Abs: 1.2 10*3/uL (ref 0.7–4.0)
MCH: 29.9 pg (ref 26.0–34.0)
MCHC: 30.2 g/dL (ref 30.0–36.0)
MCV: 98.8 fL (ref 80.0–100.0)
Monocytes Absolute: 0.2 10*3/uL (ref 0.1–1.0)
Monocytes Relative: 4 %
Neutro Abs: 2.9 10*3/uL (ref 1.7–7.7)
Neutrophils Relative %: 65 %
Platelets: 420 10*3/uL — ABNORMAL HIGH (ref 150–400)
RBC: 3.45 MIL/uL — ABNORMAL LOW (ref 3.87–5.11)
RDW: 15 % (ref 11.5–15.5)
WBC: 4.3 10*3/uL (ref 4.0–10.5)
nRBC: 0 % (ref 0.0–0.2)

## 2019-12-23 LAB — URINALYSIS, ROUTINE W REFLEX MICROSCOPIC
Bilirubin Urine: NEGATIVE
Glucose, UA: NEGATIVE mg/dL
Hgb urine dipstick: NEGATIVE
Ketones, ur: NEGATIVE mg/dL
Nitrite: NEGATIVE
Protein, ur: NEGATIVE mg/dL
Specific Gravity, Urine: 1.012 (ref 1.005–1.030)
pH: 8 (ref 5.0–8.0)

## 2019-12-23 LAB — POC SARS CORONAVIRUS 2 AG -  ED: SARS Coronavirus 2 Ag: NEGATIVE

## 2019-12-23 LAB — POC OCCULT BLOOD, ED: Fecal Occult Bld: NEGATIVE

## 2019-12-23 MED ORDER — ACETAMINOPHEN 325 MG PO TABS
650.0000 mg | ORAL_TABLET | Freq: Once | ORAL | Status: AC
  Administered 2019-12-23: 650 mg via ORAL
  Filled 2019-12-23: qty 2

## 2019-12-23 MED ORDER — ACETAMINOPHEN 325 MG PO TABS: 650.0000 mg | ORAL_TABLET | Freq: Four times a day (QID) | ORAL | 0 refills | Status: DC | PRN

## 2019-12-23 NOTE — ED Triage Notes (Addendum)
Patient c/o dizziness, fatigue, right leg and right knee pain since this AM. Patient denies injuring her knee, but has swelling present. Patient states the swelling occurred 2 weeks ago,  Patient states she has an appointment to have the fluid drawn off of her right knee tomorrow.

## 2019-12-23 NOTE — Discharge Instructions (Signed)
Your work-up today overall was reassuring.  Your blood counts were a little bit on the low side.  It is important that you follow-up with your primary care doctor for recheck. Continue taking medications as prescribed. Use acetaminophen as needed for breakthrough pain. Use the knee sleeve to help with pain. It is important that you continue to move your knee, as this will decrease stiffness. Follow-up with orthopedic doctor for further management of your knee. Return to the emergency room if you develop fevers, chest pain, nausea/vomiting, or any new, worsening, or concerning symptoms.

## 2019-12-23 NOTE — ED Provider Notes (Signed)
[facility] DEPT Provider Note   CSN: [id number] Arrival date & time: 12/23/19  1458     History Chief Complaint  Patient presents with  . Dizziness  . Fatigue  . Knee Pain    Roberta Jenkins is a 59 y.o. female presenting for evaluation of right knee pain and intermittent dizziness.  Patient states she has been having right knee pain for the past 2 weeks.  Pain worsened yesterday after she was in a car for an hour.  Patient states pain is worse when she first wakes up or when she first starts moving.  As she continues to move, pain improves.  She does report some mild swelling.  No trauma or injury.  She has seen her primary care doctor for this, is planning to follow-up with orthopedics.  She denies previous history of knee problems.  She denies recent extended travel, surgeries, immobilization, history of cancer, history of previous PE/DVT, or hormone use.  She denies calf pain or swelling.  She has been taking celecoxib and Flexeril without improvement of symptoms.  Her pain worsened today, which is why she is in the ER.  Pain is present only with movement and palpation, no pain at rest.  She denies numbness or tingling. Additionally, patient states today she has had intermittent dizziness.  She is not having dizziness currently.  She reports associated nausea, threw up x1 earlier today.  No vomiting since.  She denies fevers, chills, chest pain, shortness of breath, abdominal pain, urinary symptoms, abnormal bowel movements.  She was recently diagnosed with diabetes about 1 month ago.  Has been taking medications as prescribed.  Her blood sugar was 91 this morning.  HPI     Past Medical History:  Diagnosis Date  . Back pain   . Diabetes mellitus ([place])   . Menorrhagia 12/17/2011  . No pertinent past medical history     Patient Active Problem List   Diagnosis Date Noted  . Type 2 diabetes mellitus with hyperglycemia ([place]) 12/19/2019  . Fibroid  09/04/2015  . Menorrhagia 12/17/2011    Past Surgical History:  Procedure Laterality Date  . [place]  . MULTIPLE TOOTH EXTRACTIONS       OB History    Gravida  4   Para  3   Term  3   Preterm      AB  1   Living  3     SAB  1   TAB      Ectopic      Multiple      Live Births              Family History  Problem Relation Age of Onset  . Hypertension Mother   . Cancer Mother        colon  . Cancer Father   . Kidney disease Sister   . Hypertension Brother     Social History   Tobacco Use  . Smoking status: Never Smoker  . Smokeless tobacco: Never Used  Substance Use Topics  . Alcohol use: No  . Drug use: No    Home Medications Prior to Admission medications   Medication Sig Start Date End Date Taking? Authorizing Provider  atorvastatin (LIPITOR) 20 MG tablet Take 1 tablet (20 mg total) by mouth daily. 12/20/19  Yes Argentina Donovan, PA-C  celecoxib (CELEBREX) 200 MG capsule Take 1 capsule (200 mg total) by mouth 2 (two) times daily. X 1 week  then one daily prn pain 12/19/19  Yes Argentina Donovan, PA-C  cyclobenzaprine (FLEXERIL) 5 MG tablet Take 1 tablet (5 mg total) by mouth at bedtime. 11/28/19  Yes Wieters, Hallie C, PA-C  Insulin Glargine (BASAGLAR KWIKPEN) 100 UNIT/ML SOPN Inject 0.18 mLs (18 Units total) into the skin daily. 12/19/19 01/18/20 Yes McClung, Dionne Bucy, PA-C  metFORMIN (GLUCOPHAGE) 500 MG tablet Take 1 tablet (500 mg total) by mouth 2 (two) times daily. 12/19/19 01/18/20 Yes McClung, Dionne Bucy, PA-C  Multiple Vitamin (MULTIVITAMIN WITH MINERALS) TABS tablet Take 1 tablet by mouth daily.   Yes [provider]  acetaminophen (TYLENOL) 325 MG tablet Take 2 tablets (650 mg total) by mouth every 6 (six) hours as needed. 12/23/19   Erdem Naas, PA-C  blood glucose meter kit and supplies KIT Dispense based on patient and insurance preference. Use up to four times daily as directed. (FOR ICD-9 250.00, 250.01).  11/28/19   Wieters, Hallie C, PA-C  medroxyPROGESTERone (PROVERA) 10 MG tablet Take 1 tablet (10 mg total) by mouth daily. Patient not taking: Reported on 3/61/4431 54/0/08   Delora Fuel, MD    Allergies    Patient has no known allergies.  Review of Systems   Review of Systems  Gastrointestinal: Positive for nausea and vomiting (X1).  Musculoskeletal: Positive for arthralgias and joint swelling.  Neurological: Positive for dizziness (Intermittent, not currently).  All other systems reviewed and are negative.   Physical Exam Updated Vital Signs BP 112/71   Pulse (!) 59   Temp 98.4 F (36.9 C) (Oral)   Resp 13   Ht _0  (1.727 m)   Wt 80.3 kg   LMP 08/09/2018 (Approximate)   SpO2 100%   BMI 26.91 kg/m   Physical Exam Vitals and nursing note reviewed.  Constitutional:      General: She is not in acute distress.    Appearance: She is well-developed.     Comments: Resting comfortably in the bed in no acute distress  HENT:     Head: Normocephalic and atraumatic.  Eyes:     Extraocular Movements: Extraocular movements intact.     Conjunctiva/sclera: Conjunctivae normal.     Pupils: Pupils are equal, round, and reactive to light.     Comments: EOMI and PERRLA.  No nystagmus.  Cardiovascular:     Rate and Rhythm: Normal rate and regular rhythm.     Pulses: Normal pulses.  Pulmonary:     Effort: Pulmonary effort is normal. No respiratory distress.     Breath sounds: Normal breath sounds. No wheezing.     Comments: Clear lung sounds in all fields Abdominal:     General: There is no distension.     Palpations: Abdomen is soft. There is no mass.     Tenderness: There is no abdominal tenderness. There is no guarding or rebound.     Comments: No tenderness palpation of the abdomen.  No rigidity, guarding, distention.  Negative rebound.  No signs peritonitis.  Musculoskeletal:        General: Swelling present. Normal range of motion.     Cervical back: Normal range of motion  and neck supple.     Comments: Minimal right knee swelling.  No erythema or warmth.  Patient able to range without significant difficulty.  Pedal pulses 2+ bilaterally.  No calf pain or swelling.  Skin:    General: Skin is warm and dry.     Capillary Refill: Capillary refill takes less than 2 seconds.  Neurological:     General: No focal deficit present.     Mental Status: She is alert and oriented to person, place, and time.     GCS: GCS eye subscore is 4. GCS verbal subscore is 5. GCS motor subscore is 6.     Cranial Nerves: Cranial nerves are intact.     Sensory: Sensation is intact.     Motor: Motor function is intact.     Coordination: Coordination is intact. Coordination normal. Finger-Nose-Finger Test and Heel to [facility] Test normal.     Comments: No obvious neurologic deficits.  CN intact.  Nose to finger intact.  Fine movement and coronation intact.  Strength and sensation intact x4.     ED Results / Procedures / Treatments   Labs (all labs ordered are listed, but only abnormal results are displayed) Labs Reviewed  CBC WITH DIFFERENTIAL/PLATELET - Abnormal; Notable for the following components:      Result Value   RBC 3.45 (*)    Hemoglobin 10.3 (*)    HCT 34.1 (*)    Platelets 420 (*)    All other components within normal limits  COMPREHENSIVE METABOLIC PANEL - Abnormal; Notable for the following components:   Glucose, Bld 129 (*)    All other components within normal limits  URINALYSIS, ROUTINE W REFLEX MICROSCOPIC - Abnormal; Notable for the following components:   APPearance HAZY (*)    Leukocytes,Ua TRACE (*)    Bacteria, UA RARE (*)    All other components within normal limits  POC SARS CORONAVIRUS 2 AG -  ED  POC OCCULT BLOOD, ED    EKG EKG Interpretation  Date/Time:  Sunday December 23 2019 16:37:07 EST Ventricular Rate:  72 PR Interval:    QRS Duration: 88 QT Interval:  404 QTC Calculation: 443 R Axis:   50 Text Interpretation: Sinus rhythm Confirmed  by Gerlene Fee [phone]) on 12/23/2019 4:55:55 PM   Radiology No results found.  Procedures Procedures (including critical care time)  Medications Ordered in ED Medications  acetaminophen (TYLENOL) tablet 650 mg (650 mg Oral Given 12/23/19 1637)    ED Course  I have reviewed the triage vital signs and the nursing notes.  Pertinent labs & imaging results that were available during my care of the patient were reviewed by me and considered in my medical decision making (see chart for details).    MDM Rules/Calculators/A&P                      Pt presenting for evaluation of knee pain and intermittent dizziness with nausea.  Physical exam shows patient appears nontoxic.  Knee exam is not consistent with a septic joint.  Patient is able to range, is not erythematous, warm, patient without fevers.  Additionally, no calf pain or swelling.  No risk factors for DVT or PE.  As such, low suspicion for clot.  Likely arthritis as symptoms improve as patient ambulates for short period of time.  Will have patient follow-up with orthopedics.  No trauma or injury to indicate fracture, I do not believe she needs emergent x-ray today, and patient is agreeable. Additionally, patient with intermittent dizziness and nausea today. Will obtain blood work to assess hemoglobin, look for infection, and assess blood sugar.  Will obtain EKG.  Neurologic exam is reassuring, no deficits.  Doubt CVA.  Labs interpreted by me.  Overall reassuring, though patient is mildly anemic at 10.  3 weeks ago was 14, but that was also  when patient was very hyperglycemic and may have been hemoconcentrated.  hemoglobin has been 11 in the past, this may be baseline.  Will obtain Hemoccult, although patient is not having obvious active bleeding and is not on blood thinner, and likely not symptomatic from her hemoglobin of 10.  As such, will likely not need to be admitted for this, but instead follow-up with outpatient recheck. EKG without  STEMI.  Orthostatics reassuring. Hemoccult negative.  Urine without infection. Case discussed with attending, Dr. Sedonia Small evaluated the pt. On reassessment, patient reports pain is improved with Tylenol.  Discussed continued symptomatic treatment for her knee.  Discussed finding of anemia today and importance of follow-up primary care.  Discussed importance of follow-up with orthopedics regarding her knee pain.  At this time, patient appears safe for discharge.  Return precautions given.  Patient states she understands and agrees to plan.  Final Clinical Impression(s) / ED Diagnoses Final diagnoses:  Acute pain of right knee  Anemia, unspecified type    Rx / DC Orders ED Discharge Orders         Ordered    acetaminophen (TYLENOL) 325 MG tablet  Every 6 hours PRN     12/23/19 2029           Franchot Heidelberg, PA-C 12/23/19 2237    Maudie Flakes, MD 12/24/19 1553

## 2019-12-31 ENCOUNTER — Encounter: Payer: Self-pay | Admitting: Orthopaedic Surgery

## 2019-12-31 ENCOUNTER — Ambulatory Visit: Payer: Self-pay

## 2019-12-31 ENCOUNTER — Ambulatory Visit: Payer: BC Managed Care – PPO | Admitting: Orthopaedic Surgery

## 2019-12-31 ENCOUNTER — Other Ambulatory Visit: Payer: Self-pay

## 2019-12-31 DIAGNOSIS — M25561 Pain in right knee: Secondary | ICD-10-CM

## 2019-12-31 DIAGNOSIS — G8929 Other chronic pain: Secondary | ICD-10-CM

## 2019-12-31 MED ORDER — BUPIVACAINE HCL 0.5 % IJ SOLN
2.0000 mL | INTRAMUSCULAR | Status: AC | PRN
  Administered 2019-12-31: 11:00:00 2 mL via INTRA_ARTICULAR

## 2019-12-31 MED ORDER — METHYLPREDNISOLONE ACETATE 40 MG/ML IJ SUSP
40.0000 mg | INTRAMUSCULAR | Status: AC | PRN
  Administered 2019-12-31: 11:00:00 40 mg via INTRA_ARTICULAR

## 2019-12-31 MED ORDER — LIDOCAINE HCL 1 % IJ SOLN
2.0000 mL | INTRAMUSCULAR | Status: AC | PRN
  Administered 2019-12-31: 2 mL

## 2019-12-31 NOTE — Progress Notes (Signed)
Office Visit Note   Patient: Roberta Jenkins           Date of Birth: [birth year]           MRN: [id number] Visit Date: 12/31/2019              Requested by: Argentina Donovan, PA-C [place],  [place] [address] PCP: Patient, No Pcp Per   Assessment & Plan: Visit Diagnoses:  1. Chronic pain of right knee     Plan: Impression is right knee effusion and osteoarthritis. 30 cc of joint fluid aspirated from the right knee today and cortisone injected. Patient tolerated this well. She has been instructed to follow-up if the relief is temporary or if the effusion returns.  Follow-Up Instructions: Return if symptoms worsen or fail to improve.   Orders:  Orders Placed This Encounter  Procedures  . XR KNEE 3 VIEW RIGHT  . Cell count + diff,  w/ cryst-synvl fld   No orders of the defined types were placed in this encounter.     Procedures: Large Joint Inj: R knee on 12/31/2019 11:23 AM Indications: pain Details: 22 G needle  Arthrogram: No  Medications: 40 mg methylPREDNISolone acetate 40 MG/ML; 2 mL lidocaine 1 %; 2 mL bupivacaine 0.5 % Aspirate: 30 mL clear; sent for lab analysis Outcome: tolerated well, no immediate complications Consent was given by the patient. Patient was prepped and draped in the usual sterile fashion.       Clinical Data: No additional findings.   Subjective: Chief Complaint  Patient presents with  . Right Knee - Pain    Roberta Jenkins is a very pleasant 59 year old female who comes in for evaluation of right knee pain and swelling for the last 3 weeks injury. She denies a history of gout or inflammatory arthritis. She states the pain is constant and radiates down the leg and causes the knee to give out and catch and lock. She works as a Sports coach.   Review of Systems  Constitutional: Negative.   HENT: Negative.   Eyes: Negative.   Respiratory: Negative.   Cardiovascular: Negative.   Endocrine: Negative.   Musculoskeletal:  Negative.   Neurological: Negative.   Hematological: Negative.   Psychiatric/Behavioral: Negative.   All other systems reviewed and are negative.    Objective: Vital Signs: LMP 08/09/2018 (Approximate)   Physical Exam Vitals and nursing note reviewed.  Constitutional:      Appearance: She is well-developed.  HENT:     Head: Normocephalic and atraumatic.  Pulmonary:     Effort: Pulmonary effort is normal.  Abdominal:     Palpations: Abdomen is soft.  Musculoskeletal:     Cervical back: Neck supple.  Skin:    General: Skin is warm.     Capillary Refill: Capillary refill takes less than 2 seconds.  Neurological:     Mental Status: She is alert and oriented to person, place, and time.  Psychiatric:        Behavior: Behavior normal.        Thought Content: Thought content normal.        Judgment: Judgment normal.     Ortho Exam Right knee exam shows a moderate joint effusion. Moderate restriction range of motion secondary to pain and effusion. Collaterals and cruciates are stable. No joint line tenderness. Specialty Comments:  No specialty comments available.  Imaging: XR KNEE 3 VIEW RIGHT  Result Date: 12/31/2019 Mild osteoarthritis. Periarticular spurring.    PMFS History:  Patient Active Problem List   Diagnosis Date Noted  . Type 2 diabetes mellitus with hyperglycemia ([place]) 12/19/2019  . Fibroid 09/04/2015  . Menorrhagia 12/17/2011   Past Medical History:  Diagnosis Date  . Back pain   . Diabetes mellitus ([place])   . Menorrhagia 12/17/2011  . No pertinent past medical history     Family History  Problem Relation Age of Onset  . Hypertension Mother   . Cancer Mother        colon  . Cancer Father   . Kidney disease Sister   . Hypertension Brother     Past Surgical History:  Procedure Laterality Date  . [place]  . MULTIPLE TOOTH EXTRACTIONS     Social History   Occupational History  . Not on file  Tobacco Use  . Smoking  status: Never Smoker  . Smokeless tobacco: Never Used  Substance and Sexual Activity  . Alcohol use: No  . Drug use: No  . Sexual activity: Never    Birth control/protection: Abstinence    Comment: last intercourse was 5 yrs ago

## 2020-01-01 LAB — SYNOVIAL CELL COUNT + DIFF, W/ CRYSTALS
Basophils, %: 0 %
Eosinophils-Synovial: 0 % (ref 0–2)
Lymphocytes-Synovial Fld: 49 % (ref 0–74)
Monocyte/Macrophage: 31 % (ref 0–69)
Neutrophil, Synovial: 19 % (ref 0–24)
Synoviocytes, %: 1 % (ref 0–15)
WBC, Synovial: 78 cells/uL (ref <150)

## 2020-01-01 LAB — TIQ-NTM

## 2020-01-01 NOTE — Progress Notes (Signed)
Knee fluid shows arthritis fluid

## 2020-01-03 ENCOUNTER — Telehealth: Payer: Self-pay | Admitting: Licensed Clinical Social Worker

## 2020-01-03 NOTE — Telephone Encounter (Signed)
This MSW Intern placed call to patient to follow up regarding social work referral placed by [facility] 12/19/19. Patient did not answer, unable to leave voicemail.

## 2020-01-08 NOTE — Progress Notes (Signed)
S:     No chief complaint on file.  Patient arrives in good spirits via virtual visit. Presents for diabetes evaluation, education, and management. Patient was referred and last seen by Freeman Caldron, PA on 12/19/2019.   HPI:  Patient was seen at [facility] on 11/28/2019 and was diagnosed with new onset diabetes (sugar over 600)but was not felt to be in diabetic ketoacidosis and was started on metformin 500 mg BID and Basaglar 10 units daily. Two days later, patient was seen in ED again with sugars in the 300s (not in DKA) along with generalized weakness, polyuria, and polydipsia and Basaglar was increased to 18 units daily. At last visit with PA, sugars improved (POCT 121, Home BG 90-160s) with no changes to meds.  Patient reports Diabetes was diagnosed in 11/2019.   Family/Social History: Fhx: Mother (HTN, colon cancer, Father (cancer), Sister (kidney disease), Brother (HTN) Tobacco use: never smoker, denies smokeless tobacco Alcohol use: denies   Human resources officer affordability: [employer]  Patient reports adherence with Basaglar 18 units qAM. Patient denies adherence with Metformin, stopped taking because she ran out of refills.  Current diabetes medications include: Metformin 500 mg BID, Basaglar 18 units daily Current hypertension medications include: none Current hyperlipidemia medications include: Atorvastatin 20 mg daily  Patient denies hypoglycemic events.  Patient reported dietary habits:  Breakfast: eggs, [place] bacon and sausage Lunch/Dinner: Salmon, chicken,shrimp, baked fish and chicken, sweet potatoe Snacks: watermelon, cheese crackers, sugar-free candy and cookies, sugar-free popsicile, gluten-free granola bars Drinks: water, organic lemonade, no juice  Patient-reported exercise habits: crutches, lift 5 pound weights, exercise ball, walk a lot at work (custodian), walks for 20-30 mins occasionally on saturdays   Patient denies  nocturia (nighttime urination).  Patient denies neuropathy (nerve pain). Patient denies visual changes. Patient reports self foot exams.   O:  2 hour post-meal/random blood sugars: 103, 99  Lab Results  Component Value Date   HGBA1C 8.3 (A) 12/19/2019   There were no vitals filed for this visit.  Lipid Panel     Component Value Date/Time   CHOL 241 (H) 12/19/2019 0951   TRIG 142 12/19/2019 0951   HDL 51 12/19/2019 0951   CHOLHDL 4.7 (H) 12/19/2019 0951   LDLCALC 164 (H) 12/19/2019 0951    Clinical Atherosclerotic Cardiovascular Disease (ASCVD): No  The 10-year ASCVD risk score Mikey Bussing DC Jr., et al., 2013) is: 10.2%   Values used to calculate the score:     Age: 59 years     Sex: Female     Is Non-Hispanic African American: Yes     Diabetic: Yes     Tobacco smoker: No     Systolic Blood Pressure: [id number] mmHg     Is BP treated: No     HDL Cholesterol: 51 mg/dL     Total Cholesterol: 241 mg/dL    A/P: Diabetes newly diagnosed with A1C 8.3% currently uncontrolled. Patient is able to verbalize appropriate hypoglycemia management plan. Patient is adherent with Basaglar but not with metformin. Home sugars are within goal. When speaking with patient, she is more energetic and positive and is eager to manage her diabetes to live a healthier life. Encouraged patient to check BG in the morning prior to administering insulin and to check BG after largest meal and bring glucometer to next visit. Patient verbalized understanding.  -Continued Basalgar 18 units qAM. Instructed patient to decrease to 15 units if blood sugar < 70 mg/dL.  -Increased dose of Metformin  to 1000 mg BID. Counseling patient on side effects (nausea/vomitting/diarrhea). New prescription with refills sent to preferred pharmacy. -Extensively discussed pathophysiology of diabetes, recommended lifestyle interventions, dietary effects on blood sugar control -Counseled on s/sx of and management of hypoglycemia -Check UACR at  next lab visit  -Next A1C anticipated 03/2020.  -Newly diagnosed diabetes - Encourage patient to establish care with a PCP  ASCVD risk - primary prevention in patient with diabetes. Last LDL 164 and not controlled. ASCVD risk score is not >20%  - moderate or high intensity statin indicated.  -Continued atorvastatin 20 mg.   HM: Patient scheduled a vaccine and lab visit with pharmacy in 1 week to receive needed vaccinations and check UACR.   Written patient instructions provided. Total time in face to face counseling 20 minutes.   Follow up Pharmacist Clinic Visit in 1 week.     Patient seen with: Lorel Monaco, PharmD PGY1 Ambulatory Care Resident [place], PharmD, [place] [phone]

## 2020-01-09 ENCOUNTER — Other Ambulatory Visit: Payer: Self-pay

## 2020-01-09 ENCOUNTER — Telehealth: Payer: Self-pay | Admitting: Licensed Clinical Social Worker

## 2020-01-09 ENCOUNTER — Ambulatory Visit: Payer: BC Managed Care – PPO | Attending: Family Medicine | Admitting: Pharmacist

## 2020-01-09 DIAGNOSIS — E1165 Type 2 diabetes mellitus with hyperglycemia: Secondary | ICD-10-CM

## 2020-01-09 MED ORDER — METFORMIN HCL 500 MG PO TABS: 1000.0000 mg | ORAL_TABLET | Freq: Two times a day (BID) | ORAL | 3 refills | Status: DC

## 2020-01-09 NOTE — Telephone Encounter (Signed)
This MSW Intern placed call to patient regarding referral placed by PA [facility] 12/19/19 to follow up about grief reaction. Patient did not answer, unable to leave message. This is the third attempt to contact patient.

## 2020-01-16 ENCOUNTER — Ambulatory Visit: Payer: BC Managed Care – PPO | Attending: Family Medicine | Admitting: Pharmacist

## 2020-01-16 ENCOUNTER — Other Ambulatory Visit: Payer: Self-pay

## 2020-01-16 DIAGNOSIS — E1165 Type 2 diabetes mellitus with hyperglycemia: Secondary | ICD-10-CM

## 2020-01-16 NOTE — Patient Instructions (Signed)
Thank you for coming to see me today. Please do the following:  1. Continue your current medications.  2. Continue checking blood sugars at home. 3. Continue making the lifestyle changes we've discussed together during our visit. Diet and exercise play a significant role in improving your blood sugars.  4. Follow-up with me in 1 week for your tetanus shot.  5. Make an appointment with your PCP in May.   Hypoglycemia or low blood sugar:   Low blood sugar can happen quickly and may become an emergency if not treated right away.   While this shouldn't happen often, it can be brought upon if you skip a meal or do not eat enough. Also, if your insulin or other diabetes medications are dosed too high, this can cause your blood sugar to go to low.   Warning signs of low blood sugar include: 1. Feeling shaky or dizzy 2. Feeling weak or tired  3. Excessive hunger 4. Feeling anxious or upset  5. Sweating even when you aren't exercising  What to do if I experience low blood sugar? 1. Check your blood sugar with your meter. If lower than 70, proceed to step 2.  2. Treat with 3-4 glucose tablets or 3 packets of regular sugar. If these aren't around, you can try hard candy. Yet another option would be to drink 4 ounces of fruit juice or 6 ounces of REGULAR soda.  3. Re-check your sugar in 15 minutes. If it is still below 70, do what you did in step 2 again. If has come back up, go ahead and eat a snack or small meal at this time.

## 2020-01-16 NOTE — Progress Notes (Signed)
   S:     No chief complaint on file.  Patient arrives in good spirits. Presents for diabetes evaluation, education, and management. Patient was referred and last seen by Freeman Caldron, PA on 12/19/2019. We saw her on 01/09/20 and increased her metformin to 1000 mg BID.   Family/Social History: Fhx: Mother (HTN, colon cancer, Father (cancer), Sister (kidney disease), Brother (HTN) Tobacco use: never smoker, denies smokeless tobacco Alcohol use: denies   Human resources officer affordability: [place] [employer]  Patient reports adherence with medications.  Current diabetes medications include: Metformin 1000 mg BID, Basaglar 18 units daily Current hypertension medications include: none Current hyperlipidemia medications include: Atorvastatin 20 mg daily  Patient denies hypoglycemic events.  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  Patient denies neuropathy (nerve pain). Patient denies visual changes. Patient reports self foot exams.   O:  Checking blood sugars at home.  Gives range: 90s-low 130s.  Lab Results  Component Value Date   HGBA1C 8.3 (A) 12/19/2019   There were no vitals filed for this visit.  Lipid Panel     Component Value Date/Time   CHOL 241 (H) 12/19/2019 0951   TRIG 142 12/19/2019 0951   HDL 51 12/19/2019 0951   CHOLHDL 4.7 (H) 12/19/2019 0951   LDLCALC 164 (H) 12/19/2019 0951    Clinical Atherosclerotic Cardiovascular Disease (ASCVD): No  The 10-year ASCVD risk score  Mikey Bussing DC Jr., et al., 2013) is: 10.2%   Values used to calculate the score:     Age: 59 years     Sex: Female     Is Non-Hispanic African American: Yes     Diabetic: Yes     Tobacco smoker: No     Systolic Blood Pressure: [id number] mmHg     Is BP treated: No     HDL Cholesterol: 51 mg/dL     Total Cholesterol: 241 mg/dL    A/P: Diabetes newly diagnosed with A1C 8.3% currently uncontrolled. Home sugars are at goal. Patient is able to verbalize appropriate hypoglycemia management plan. Patient is adherent with medications..  -Continued Basalgar 18 units qAM. Instructed patient to decrease to 15 units if blood sugar < 70 mg/dL.  -Continued metformin 1000 mg BID.  -Extensively discussed pathophysiology of diabetes, recommended lifestyle interventions, dietary effects on blood sugar control -Counseled on s/sx of and management of hypoglycemia -UACR -Next A1C anticipated 03/2020.  -Newly diagnosed diabetes - Encourage patient to establish care with a PCP  ASCVD risk - primary prevention in patient with diabetes. Last LDL 164 and not controlled. ASCVD risk score is not >20%  - moderate or high intensity statin indicated.  -Continued atorvastatin 20 mg.   HM: PNA, tetanus and influenza due. - Flu, Pneumonia given; will coordinate with pharmacy for patient assistance.  - Will defer Adacel to next week.   Written patient instructions provided. Total time in face to face counseling 20 minutes.   Follow up Pharmacist Clinic Visit in 1 week for tetanus shot.  Benard Halsted, PharmD, [place] [phone]

## 2020-01-17 LAB — MICROALBUMIN / CREATININE URINE RATIO
Creatinine, Urine: 65.7 mg/dL
Microalb/Creat Ratio: <5 mg/g creat (ref 0–29)
Microalbumin, Urine: <3 ug/mL

## 2020-01-24 ENCOUNTER — Ambulatory Visit: Payer: BC Managed Care – PPO | Attending: Family Medicine | Admitting: Pharmacist

## 2020-01-24 ENCOUNTER — Other Ambulatory Visit: Payer: Self-pay

## 2020-01-24 DIAGNOSIS — Z23 Encounter for immunization: Secondary | ICD-10-CM | POA: Diagnosis not present

## 2020-01-24 DIAGNOSIS — E1165 Type 2 diabetes mellitus with hyperglycemia: Secondary | ICD-10-CM | POA: Diagnosis not present

## 2020-01-24 LAB — GLUCOSE, POCT (MANUAL RESULT ENTRY): POC Glucose: 87 mg/dl (ref 70–99)

## 2020-01-24 NOTE — Progress Notes (Signed)
    S:     No chief complaint on file.  Patient arrives in good spirits. Presents for diabetes evaluation, education, and management Patient was referred and last seen by Freeman Caldron, PA on 12/19/2019. Patient was last seen by [facility] Pharmacist on 01/16/2020.  Family/Social History: Fhx: Mother (HTN, colon cancer, Father (cancer), Sister (kidney disease), Brother (HTN) Tobacco use: never smoker, denies smokeless tobacco Alcohol use: denies   Human resources officer affordability: [place] P[employer]  Patient reports adherence with medications.  Current diabetes medications include: Metformin 1000 mg BID, Basaglar 18 units daily Current hypertension medications include: none Current hyperlipidemia medications include: Atorvastatin 20 mg daily  Patient denies hypoglycemic events.  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  Patient denies neuropathy (nerve pain). Patient denies visual changes. Patient reports self foot exams.     O:  POCT: 87 (fasting)  Home fasting blood sugars: 110, 109 2 hour post-meal/random blood sugars: 110, no more than 125   Lab Results  Component Value Date   HGBA1C 8.3 (A) 12/19/2019   There were no vitals filed for this visit.  Lipid Panel     Component Value Date/Time   CHOL 241 (H) 12/19/2019 0951   TRIG 142 12/19/2019 0951   HDL 51 12/19/2019 0951   CHOLHDL 4.7 (H) 12/19/2019 0951   LDLCALC 164 (H) 12/19/2019 0951    Clinical Atherosclerotic  Cardiovascular Disease (ASCVD): No  The 10-year ASCVD risk score Mikey Bussing DC Jr., et al., 2013) is: 10.2%   Values used to calculate the score:     Age: 59 years     Sex: Female     Is Non-Hispanic African American: Yes     Diabetic: Yes     Tobacco smoker: No     Systolic Blood Pressure: [id number] mmHg     Is BP treated: No     HDL Cholesterol: 51 mg/dL     Total Cholesterol: 241 mg/dL    A/P: Diabetes newly diagnosed with A1C 8.3% currently uncontrolled. Home sugars are at goal with no hypoglycemic events. Patient is able to verbalize appropriate hypoglycemia management plan. Patient is adherent with medications. -Continued Basalgar 18 units qAM. Instructed patient to decrease to 15 units if blood sugar < 70 mg/dL.  -Continued metformin 1000 mg BID.  -Extensively discussed pathophysiology of diabetes, recommended lifestyle interventions, dietary effects on blood sugar control -Counseled on s/sx of and management of hypoglycemia -Next A1C anticipated 03/2020.  -Newly diagnosed diabetes - Encouraged patient to establish care with a PCP  HM: Tetanus due. -Patient received vaccination against Tetanus. Consent given. Counseling provided. No contraindications exists. Vaccine administered without incident.   Written patient instructions provided. Total time in face to face counseling 20 minutes.   Follow up Pharmacist Clinic Visit in 1 month.     Patient seen with: Lorel Monaco, PharmD PGY1 Ambulatory Care Resident [facility]

## 2020-01-31 ENCOUNTER — Encounter: Payer: Self-pay | Admitting: Physician Assistant

## 2020-01-31 ENCOUNTER — Ambulatory Visit (INDEPENDENT_AMBULATORY_CARE_PROVIDER_SITE_OTHER): Payer: BC Managed Care – PPO | Admitting: Orthopaedic Surgery

## 2020-01-31 ENCOUNTER — Other Ambulatory Visit: Payer: Self-pay

## 2020-01-31 ENCOUNTER — Telehealth: Payer: Self-pay

## 2020-01-31 DIAGNOSIS — M1711 Unilateral primary osteoarthritis, right knee: Secondary | ICD-10-CM | POA: Diagnosis not present

## 2020-01-31 NOTE — Progress Notes (Signed)
Office Visit Note   Patient: Roberta Jenkins           Date of Birth: 1961/01/08           MRN: [id number] Visit Date: 01/31/2020              Requested by: No referring provider defined for this encounter. PCP: Patient, No Pcp Per   Assessment & Plan: Visit Diagnoses:  1. Unilateral primary osteoarthritis, right knee     Plan: Impression is right knee arthritis flareup.  At this point, we will submit for approval for viscosupplementation injection.  She will follow up with [place] once that has been approved.  I have called her in a prescription of tramadol to use in the meantime.    This patient is diagnosed with osteoarthritis of the knee(s).    Radiographs show evidence of joint space narrowing, osteophytes, subchondral sclerosis and/or subchondral cysts.  This patient has knee pain which interferes with functional and activities of daily living.    This patient has experienced inadequate response, adverse effects and/or intolerance with conservative treatments such as acetaminophen, NSAIDS, topical creams, physical therapy or regular exercise, knee bracing and/or weight loss.   This patient has experienced inadequate response or has a contraindication to intra articular steroid injections for at least 3 months.   This patient is not scheduled to have a total knee replacement within 6 months of starting treatment with viscosupplementation.   Follow-Up Instructions: Return for once approved for visco inj.   Orders:  No orders of the defined types were placed in this encounter.  No orders of the defined types were placed in this encounter.     Procedures: No procedures performed   Clinical Data: No additional findings.   Subjective: Chief Complaint  Patient presents with  . Right Knee - Pain    HPI patient is a pleasant 59 year old female who comes in today with recurrent right knee pain.  She was seen in our office for right knee arthritis exacerbation at 1  March.  Right knee was aspirated and injected with cortisone.  This significantly helped but only lasted for about 3 months.  Her pain has returned and is started to worsen.  Her swelling has also returned.  Her pain is aggravated with walking.  She has taken Tylenol with mild relief of symptoms.  No previous viscosupplementation injection.  Review of Systems as detailed in HPI.  All others reviewed and are negative.   Objective: Vital Signs: LMP 08/09/2018 (Approximate)   Physical Exam well-developed well-nourished female in no acute distress.  Alert and oriented x3.  Ortho Exam examination of her right knee shows a trace effusion.  Range of motion 0 to 90 degrees.  No joint line tenderness.  She is neurovascular intact distally.  Specialty Comments:  No specialty comments available.  Imaging: No new imaging   PMFS History: Patient Active Problem List   Diagnosis Date Noted  . Type 2 diabetes mellitus with hyperglycemia ([place]) 12/19/2019  . Fibroid 09/04/2015  . Menorrhagia 12/17/2011   Past Medical History:  Diagnosis Date  . Back pain   . Diabetes mellitus ([place])   . Menorrhagia 12/17/2011  . No pertinent past medical history     Family History  Problem Relation Age of Onset  . Hypertension Mother   . Cancer Mother        colon  . Cancer Father   . Kidney disease Sister   . Hypertension Brother  Past Surgical History:  Procedure Laterality Date  . [place]  . MULTIPLE TOOTH EXTRACTIONS     Social History   Occupational History  . Not on file  Tobacco Use  . Smoking status: Never Smoker  . Smokeless tobacco: Never Used  Substance and Sexual Activity  . Alcohol use: No  . Drug use: No  . Sexual activity: Never    Birth control/protection: Abstinence    Comment: last intercourse was 5 yrs ago

## 2020-01-31 NOTE — Telephone Encounter (Signed)
Submitted for VOB for Durolane-Right knee 

## 2020-01-31 NOTE — Telephone Encounter (Signed)
Please submit for gel inj- Dr Erlinda Hong- Right knee

## 2020-02-04 ENCOUNTER — Telehealth: Payer: Self-pay

## 2020-02-04 NOTE — Telephone Encounter (Signed)
Prior auth required for HA approval. Submitted this today

## 2020-02-05 ENCOUNTER — Ambulatory Visit: Payer: BC Managed Care – PPO | Admitting: Orthopaedic Surgery

## 2020-02-07 ENCOUNTER — Telehealth: Payer: Self-pay | Admitting: Orthopaedic Surgery

## 2020-02-07 NOTE — Telephone Encounter (Signed)
PA submitted 3 days ago. Still waiting on approval

## 2020-02-07 NOTE — Telephone Encounter (Signed)
Pt was calling to check on the status of her injection approval; if she was approved she would like to go ahead and get scheduled.   [phone]

## 2020-02-08 NOTE — Telephone Encounter (Signed)
Pt informed and stated understanding

## 2020-02-11 ENCOUNTER — Telehealth: Payer: Self-pay

## 2020-02-11 NOTE — Telephone Encounter (Signed)
Pt called and informed of approval and copay and stated understanding. Appointment was scheduled for wed

## 2020-02-11 NOTE — Telephone Encounter (Signed)
Approved for Durolane-Right knee Dr. Frederik Pear and Bill Covered @ 100% after $94 copay Prior auth required auth # [id number] Dates: 02/08/20-02/07/21

## 2020-02-13 ENCOUNTER — Encounter: Payer: Self-pay | Admitting: Orthopaedic Surgery

## 2020-02-13 ENCOUNTER — Other Ambulatory Visit: Payer: Self-pay

## 2020-02-13 ENCOUNTER — Ambulatory Visit (INDEPENDENT_AMBULATORY_CARE_PROVIDER_SITE_OTHER): Payer: BC Managed Care – PPO | Admitting: Orthopaedic Surgery

## 2020-02-13 VITALS — Ht 68.0 in | Wt 177.0 lb

## 2020-02-13 DIAGNOSIS — M1711 Unilateral primary osteoarthritis, right knee: Secondary | ICD-10-CM

## 2020-02-13 MED ORDER — SODIUM HYALURONATE 60 MG/3ML IX PRSY
60.0000 mg | PREFILLED_SYRINGE | INTRA_ARTICULAR | Status: AC | PRN
  Administered 2020-02-13: 15:00:00 60 mg via INTRA_ARTICULAR

## 2020-02-13 NOTE — Progress Notes (Signed)
   Procedure Note  Patient: Roberta Jenkins             Date of Birth: 06-13-1961           MRN: [id number]             Visit Date: 02/13/2020  Procedures: Visit Diagnoses:  1. Unilateral primary osteoarthritis, right knee     Large Joint Inj: R knee on 02/13/2020 3:04 PM Indications: pain Details: 22 G needle  Arthrogram: No  Medications: 60 mg Sodium Hyaluronate 60 MG/3ML Outcome: tolerated well, no immediate complications Patient was prepped and draped in the usual sterile fashion.

## 2020-02-20 ENCOUNTER — Ambulatory Visit: Payer: BC Managed Care – PPO | Admitting: Orthopaedic Surgery

## 2020-02-26 ENCOUNTER — Other Ambulatory Visit: Payer: Self-pay

## 2020-02-26 ENCOUNTER — Ambulatory Visit (INDEPENDENT_AMBULATORY_CARE_PROVIDER_SITE_OTHER): Payer: BC Managed Care – PPO | Admitting: Orthopaedic Surgery

## 2020-02-26 ENCOUNTER — Encounter: Payer: Self-pay | Admitting: Orthopaedic Surgery

## 2020-02-26 DIAGNOSIS — M1711 Unilateral primary osteoarthritis, right knee: Secondary | ICD-10-CM

## 2020-02-26 MED ORDER — TRAMADOL HCL 50 MG PO TABS: 50.0000 mg | ORAL_TABLET | Freq: Three times a day (TID) | ORAL | 2 refills | Status: DC | PRN

## 2020-02-26 MED ORDER — DICLOFENAC SODIUM 75 MG PO TBEC: 75.0000 mg | DELAYED_RELEASE_TABLET | Freq: Two times a day (BID) | ORAL | 2 refills | Status: DC

## 2020-02-26 NOTE — Progress Notes (Signed)
   Office Visit Note   Patient: Roberta Jenkins           Date of Birth: Sep 13, 1961           MRN: [id number] Visit Date: 02/26/2020              Requested by: No referring provider defined for this encounter. PCP: Patient, No Pcp Per   Assessment & Plan: Visit Diagnoses:  1. Unilateral primary osteoarthritis, right knee     Plan: Impression is right knee osteoarthritis.  I have discussed with the patient that it can take up to 6 weeks for her to notice any relief from the viscosupplementation injection.  Her injection was only 2 weeks ago.  If she is still having pain 4 weeks from now, she will call us and let us know.  In the interim, I will call in an NSAID and tramadol to use as needed.  Follow-Up Instructions: Return if symptoms worsen or fail to improve.   Orders:  No orders of the defined types were placed in this encounter.  No orders of the defined types were placed in this encounter.     Procedures: No procedures performed   Clinical Data: No additional findings.   Subjective: Chief Complaint  Patient presents with  . Right Knee - Follow-up    HPI is a pleasant 59 year old female with underlying right knee degenerative joint disease who presents to our clinic today with continued right knee pain.  She was seen by [place] approximately 2 weeks ago where Durolane injection was performed.  She has not had any relief as of yet.  No new injury or change in activity.     Objective: Vital Signs: LMP 08/09/2018 (Approximate)     Ortho Exam stable right knee exam  Specialty Comments:  No specialty comments available.  Imaging: No new imaging   PMFS History: Patient Active Problem List   Diagnosis Date Noted  . Type 2 diabetes mellitus with hyperglycemia ([place]) 12/19/2019  . Fibroid 09/04/2015  . Menorrhagia 12/17/2011   Past Medical History:  Diagnosis Date  . Back pain   . Diabetes mellitus ([place])   . Menorrhagia 12/17/2011  . No pertinent past  medical history     Family History  Problem Relation Age of Onset  . Hypertension Mother   . Cancer Mother        colon  . Cancer Father   . Kidney disease Sister   . Hypertension Brother     Past Surgical History:  Procedure Laterality Date  . [place]  . MULTIPLE TOOTH EXTRACTIONS     Social History   Occupational History  . Not on file  Tobacco Use  . Smoking status: Never Smoker  . Smokeless tobacco: Never Used  Substance and Sexual Activity  . Alcohol use: No  . Drug use: No  . Sexual activity: Never    Birth control/protection: Abstinence    Comment: last intercourse was 5 yrs ago

## 2020-02-29 ENCOUNTER — Encounter: Payer: Self-pay | Admitting: Pharmacist

## 2020-02-29 ENCOUNTER — Other Ambulatory Visit: Payer: Self-pay

## 2020-02-29 ENCOUNTER — Ambulatory Visit: Payer: BC Managed Care – PPO | Attending: Family Medicine | Admitting: Pharmacist

## 2020-02-29 DIAGNOSIS — E1165 Type 2 diabetes mellitus with hyperglycemia: Secondary | ICD-10-CM

## 2020-02-29 DIAGNOSIS — Z794 Long term (current) use of insulin: Secondary | ICD-10-CM

## 2020-02-29 NOTE — Progress Notes (Signed)
    S:     No chief complaint on file.  Patient arrives in good spirits. Presents for diabetes evaluation, education, and management Patient was referred and last seen by Freeman Caldron, PA on 12/19/2019. Patient was last seen by [facility] Pharmacist on 01/24/2020.  Family/Social History: Fhx: Mother (HTN, colon cancer, Father (cancer), Sister (kidney disease), Brother (HTN) Tobacco use: never smoker, denies smokeless tobacco Alcohol use: denies   Human resources officer affordability: [place] PPO  Patient reports adherence with medications.  Current diabetes medications include: Metformin 1000 mg BID, Basaglar 18 units daily Current hypertension medications include: none Current hyperlipidemia medications include: Atorvastatin 20 mg daily  Patient denies hypoglycemic events.  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  Patient denies neuropathy (nerve pain). Patient denies visual changes. Patient reports self foot exams.     O:  POCT: 93 (fasting)  Home fasting blood sugars: 110, 109 2 hour post-meal/random blood sugars: 110, no more than 125   Lab Results  Component Value Date   HGBA1C 8.3 (A) 12/19/2019   There were no vitals filed for this visit.  Lipid Panel     Component Value Date/Time   CHOL 241 (H) 12/19/2019 0951   TRIG 142 12/19/2019 0951   HDL 51 12/19/2019 0951   CHOLHDL 4.7 (H) 12/19/2019 0951   LDLCALC 164 (H) 12/19/2019 0951    Clinical Atherosclerotic  Cardiovascular Disease (ASCVD): No  The 10-year ASCVD risk score Mikey Bussing DC Jr., et al., 2013) is: 10.2%   Values used to calculate the score:     Age: 59 years     Sex: Female     Is Non-Hispanic African American: Yes     Diabetic: Yes     Tobacco smoker: No     Systolic Blood Pressure: [id number] mmHg     Is BP treated: No     HDL Cholesterol: 51 mg/dL     Total Cholesterol: 241 mg/dL    A/P: Diabetes newly diagnosed with A1C 8.3% currently uncontrolled. Home sugars are at goal with no hypoglycemic events. Patient is able to verbalize appropriate hypoglycemia management plan. Patient is adherent with medications. Of note, her LDL is 165 and may need intensification of statin therapy to achieve goal. Will defer to PCP.  -Continued current regimen. -Extensively discussed pathophysiology of diabetes, recommended lifestyle interventions, dietary effects on blood sugar control -Counseled on s/sx of and management of hypoglycemia -Next A1C anticipated 03/2020.   HM: UTD on PNA and tetanus vaccines. Has not received Covid vaccine yet.   Written patient instructions provided. Total time in face to face counseling 20 minutes.   Follow up Pharmacist Clinic Visit in 1 month.     Benard Halsted, PharmD, [place] [phone]

## 2020-03-12 ENCOUNTER — Ambulatory Visit: Payer: BC Managed Care – PPO | Admitting: Family Medicine

## 2020-03-13 ENCOUNTER — Ambulatory Visit: Payer: BC Managed Care – PPO | Admitting: Family Medicine

## 2020-03-19 ENCOUNTER — Ambulatory Visit (HOSPITAL_COMMUNITY)
Admission: EM | Admit: 2020-03-19 | Discharge: 2020-03-19 | Disposition: A | Discharge status: Home / Self Care | Payer: BC Managed Care – PPO | Attending: Family Medicine | Admitting: Family Medicine

## 2020-03-19 ENCOUNTER — Encounter (HOSPITAL_COMMUNITY): Payer: Self-pay

## 2020-03-19 ENCOUNTER — Other Ambulatory Visit: Payer: Self-pay

## 2020-03-19 DIAGNOSIS — G8929 Other chronic pain: Secondary | ICD-10-CM

## 2020-03-19 DIAGNOSIS — M25561 Pain in right knee: Secondary | ICD-10-CM

## 2020-03-19 MED ORDER — PREDNISONE 10 MG (21) PO TBPK: ORAL_TABLET | Freq: Every day | ORAL | 0 refills | Status: DC

## 2020-03-19 NOTE — ED Provider Notes (Signed)
[place]   [id number] 03/19/20 Arrival Time: [id number]  ASSESSMENT & PLAN:  1. Chronic pain of right knee     No indication for plain imaging of knee. Has an orthopaedist with whom she will schedule follow up. May need MRI.  Begin trial of: Meds ordered this encounter  Medications  . predniSONE (STERAPRED UNI-PAK 21 TAB) 10 MG (21) TBPK tablet    Sig: Take by mouth daily. Take as directed.    Dispense:  21 tablet    Refill:  0    Orders Placed This Encounter  Procedures  . Apply knee brace/sleeve     Discharge Instructions     Please remember to watch your blood sugars while taking prednisone.    WBAT. OTC analgesics as needed. Has Tramadol to use prn.  Reviewed expectations re: course of current medical issues. Questions answered. Outlined signs and symptoms indicating need for more acute intervention. Patient verbalized understanding. After Visit Summary given.  SUBJECTIVE: History from: patient. Roberta Jenkins is a 59 y.o. female who reports fairly persistent moderate pain of her right knee with swelling; has been drained by ortho two weeks ago; pain described as dull ache with occasional sharp pains; without radiation. Duration: many years; no trauma. Symptoms have gradually worsened since beginning. Aggravating factors: certain movements, weight bearing and prolonged walking/standing. Alleviating factors: have not been identified. Associated symptoms: none reported. Extremity sensation changes or weakness: none. Self treatment: OTC analgesics without much relief; Tramadol with temp help.   Past Surgical History:  Procedure Laterality Date  . [place]  . MULTIPLE TOOTH EXTRACTIONS        OBJECTIVE:  Vitals:   03/19/20 0929  BP: (!) 86/55  Pulse: 71  Resp: 14  Temp: 98 F (36.7 C)  TempSrc: Oral  SpO2: 100%    General appearance: alert; no distress HEENT: ; AT Neck: supple with FROM Resp: unlabored  respirations Extremities: . RLE: warm with well perfused appearance; poorly localized mild to moderate tenderness over right anterior/medial knee; without gross deformities; swelling: moderate; bruising: none; knee ROM: normal, with discomfort CV: brisk extremity capillary refill of RLE; 2+ DP pulse of RLE. Skin: warm and dry; no visible rashes Neurologic: gait normal but favors RLE; normal sensation and strength of RLE Psychological: alert and cooperative; normal mood and affect    No Known Allergies  Past Medical History:  Diagnosis Date  . Back pain   . Diabetes mellitus ([place])   . Menorrhagia 12/17/2011  . No pertinent past medical history    Social History   Socioeconomic History  . Marital status: Divorced    Spouse name: Not on file  . Number of children: Not on file  . Years of education: Not on file  . Highest education level: Not on file  Occupational History  . Not on file  Tobacco Use  . Smoking status: Never Smoker  . Smokeless tobacco: Never Used  Substance and Sexual Activity  . Alcohol use: No  . Drug use: No  . Sexual activity: Never    Birth control/protection: Abstinence    Comment: last intercourse was 5 yrs ago  Other Topics Concern  . Not on file  Social History Narrative  . Not on file   Social Determinants of Health   Financial Resource Strain:   . Difficulty of Paying Living Expenses:   Food Insecurity:   . Worried About Charity fundraiser in the Last Year:   . Ran  Out of Food in the Last Year:   Transportation Needs:   . Lack of Transportation (Medical):   Marland Kitchen Lack of Transportation (Non-Medical):   Physical Activity:   . Days of Exercise per Week:   . Minutes of Exercise per Session:   Stress:   . Feeling of Stress :   Social Connections:   . Frequency of Communication with Friends and Family:   . Frequency of Social Gatherings with Friends and Family:   . Attends Religious Services:   . Active Member of Clubs or Organizations:     . Attends Archivist Meetings:   Marland Kitchen Marital Status:    Family History  Problem Relation Age of Onset  . Hypertension Mother   . Cancer Mother        colon  . Cancer Father   . Kidney disease Sister   . Hypertension Brother    Past Surgical History:  Procedure Laterality Date  . [place]  . MULTIPLE TOOTH Jorge Mandril, MD 03/19/20 1022

## 2020-03-19 NOTE — Discharge Instructions (Signed)
Please remember to watch your blood sugars while taking prednisone.

## 2020-03-19 NOTE — ED Triage Notes (Signed)
Patient reports right knee pain that is chronic. Reports she has seen orthopedist, who drained fluid and gave injections into the joint, but it has not helped. Reports she was in pain all night lasts night, which is what brings her in today.

## 2020-03-26 ENCOUNTER — Other Ambulatory Visit: Payer: Self-pay

## 2020-03-26 ENCOUNTER — Ambulatory Visit: Payer: Self-pay | Attending: Family Medicine | Admitting: Pharmacist

## 2020-03-26 DIAGNOSIS — E1165 Type 2 diabetes mellitus with hyperglycemia: Secondary | ICD-10-CM

## 2020-03-26 DIAGNOSIS — Z794 Long term (current) use of insulin: Secondary | ICD-10-CM

## 2020-03-26 LAB — POCT GLYCOSYLATED HEMOGLOBIN (HGB A1C): Hemoglobin A1C: 5.4 % (ref 4.0–5.6)

## 2020-03-26 NOTE — Progress Notes (Addendum)
   TS is a 18 YOF at 8:30 for T2DM  Patient arrives 8:30 on 03/26/2020.  Presents for diabetes evaluation, education, and management Patient was referred and last seen by [facility] on 02/29/20  Patient reports Diabetes was diagnosed in Feb 2021.  Family/Social History: mother has HTN and cancer, father (deceased) had cancer, sister has kidney disease, brother has HTN  Insurance coverage/medication affordability: BC[employer] state health PPO  Medication adherence reported: reports adherence  Current diabetes medications include: metformin 1000 mg BID, Basaglar 18 units daily  Current hypertension medications include: N/A Current hyperlipidemia medications include: atorvastatin 20 mg daily  Patient denies hypoglycemic events   Patient reported dietary habits: patient reports healthier dietary habits and drinking a lot of water.   Patient-reported exercise habits: tries to exercise every night     Patient denies nocturia (nighttime urination) Patient denies neuropathy (nerve pain). Patient denies visual changes, but does report physician said she may have glaucoma Patient reports self foot exams.    O POCT: BG 103  A1c: 5.4% on 03/26/20   Lipid panel: Last panel on 12/19/19, TC = 241 (high), HDL = 51 (good), LDL = 164 (high, above 99), TG = 142 (good) - receiving lipid panel today  Home fasting blood sugars: reports all sugars at home are around 107 daily    Clinical Atherosclerotic Cardiovascular Disease (ASCVD): No   A/P: Diabetes longstanding currently controlled. Patient is able to verbalize appropriate hypoglycemia management plan. Medication adherence appears great.  -Next A1C anticipated 06/2020.  DM: Currently controlled based on A1c result. A1c today is down from previous 8.3% to 5.4%. No medication changes made today since pt A1c was great. Pt has made significant diet and exercise changes.  . A1c: 5.4% . Continue current metformin and Basaglar regimen . Commended pt on  improvement with lifestyle . Encouraged continued adherence  . POCT: A1c and BG   ASCVD Risk: Primary prevention in pt with DM. Last LDL was 164 in February 2021. From April, ASCVD risk score was 10.2%. Pt may require higher intensity statin therapy in order to reach LDL goal < 70 mg/dL. Pt agreed to a lipid panel and her statin therapy regimen will be evaluated following results.  . Lipid panel  . Continue current atorvastatin regimen     Penni Homans, PharmD Candidate  [employer] 2022  Benard Halsted, PharmD, [place] [phone]

## 2020-03-27 ENCOUNTER — Other Ambulatory Visit: Payer: Self-pay | Admitting: Physician Assistant

## 2020-03-27 DIAGNOSIS — E785 Hyperlipidemia, unspecified: Secondary | ICD-10-CM

## 2020-03-27 LAB — LIPID PANEL
Chol/HDL Ratio: 4.7 ratio — ABNORMAL HIGH (ref 0.0–4.4)
Cholesterol, Total: 251 mg/dL — ABNORMAL HIGH (ref 100–199)
HDL: 53 mg/dL (ref >39)
LDL Chol Calc (NIH): 162 mg/dL — ABNORMAL HIGH (ref 0–99)
Triglycerides: 199 mg/dL — ABNORMAL HIGH (ref 0–149)
VLDL Cholesterol Cal: 36 mg/dL (ref 5–40)

## 2020-03-27 MED ORDER — ATORVASTATIN CALCIUM 20 MG PO TABS: 20.0000 mg | ORAL_TABLET | Freq: Every day | ORAL | 3 refills | Status: DC

## 2020-04-07 ENCOUNTER — Ambulatory Visit: Payer: BC Managed Care – PPO | Admitting: Family Medicine

## 2020-04-22 ENCOUNTER — Ambulatory Visit: Payer: 59 | Attending: Nurse Practitioner | Admitting: Nurse Practitioner

## 2020-04-22 ENCOUNTER — Encounter: Payer: Self-pay | Admitting: Nurse Practitioner

## 2020-04-22 ENCOUNTER — Other Ambulatory Visit: Payer: Self-pay

## 2020-04-22 VITALS — Ht 68.0 in | Wt 165.0 lb

## 2020-04-22 DIAGNOSIS — E1165 Type 2 diabetes mellitus with hyperglycemia: Secondary | ICD-10-CM

## 2020-04-22 DIAGNOSIS — Z1231 Encounter for screening mammogram for malignant neoplasm of breast: Secondary | ICD-10-CM

## 2020-04-22 DIAGNOSIS — G8929 Other chronic pain: Secondary | ICD-10-CM

## 2020-04-22 DIAGNOSIS — Z794 Long term (current) use of insulin: Secondary | ICD-10-CM

## 2020-04-22 DIAGNOSIS — M25561 Pain in right knee: Secondary | ICD-10-CM

## 2020-04-22 DIAGNOSIS — Z7689 Persons encountering health services in other specified circumstances: Secondary | ICD-10-CM

## 2020-04-22 NOTE — Progress Notes (Signed)
Virtual Visit via Telephone Note Due to national recommendations of social distancing due to [place] 19, telehealth visit is felt to be most appropriate for this patient at this time.  I discussed the limitations, risks, security and privacy concerns of performing an evaluation and management service by telephone and the availability of in person appointments. I also discussed with the patient that there may be a patient responsible charge related to this service. The patient expressed understanding and agreed to proceed.    I connected with Roberta Jenkins on 04/22/20  at  10:30 AM EDT  EDT by telephone and verified that I am speaking with the correct person using two identifiers.   Consent I discussed the limitations, risks, security and privacy concerns of performing an evaluation and management service by telephone and the availability of in person appointments. I also discussed with the patient that there may be a patient responsible charge related to this service. The patient expressed understanding and agreed to proceed.   Location of Patient: Private Residence    Location of Provider: [place] and [place] participating in Telemedicine visit: Geryl Rankins FNP-BC [place]    History of Present Illness: Telemedicine visit for: Establish Care  Patient has been counseled on age-appropriate routine health concerns for screening and prevention. These are reviewed and up-to-date. Referrals have been placed accordingly. Immunizations are up-to-date or declined.     Mammogram: "it's been years ago". PAP Smear: 2019 through the year for [place]  She is no longer seeing Dr. Erlinda Hong for her knee. Still having knee pain. Recommended knee pain,   DM TYPE 2 Diagnosed in the emergency department in January 2021 after being noted for elevated blood glucose level of 588.  Monitors her blood glucose levels once a day with  average reading range 120s.  She is taking Metformin 1000 mg twice a day and Basaglar 10 units daily.  LDL not at goal and she endorses medication adherence taking atorvastatin 20 mg daily. Lab Results  Component Value Date   HGBA1C 5.4 03/26/2020   Lab Results  Component Value Date   LDLCALC 162 (H) 03/26/2020   BP Readings from Last 3 Encounters:  03/19/20 (!) 86/55  12/23/19 112/71  12/19/19 109/72   Right Knee pain Requesting MRI today for right knee pain.  Has been followed by orthopedic surgeon Dr. Erlinda Hong.  In [place] was noted for right knee effusion and osteoarthritis and required aspiration of fluid from the knee as well as cortisone injection.  Other treatments have included Celebrex, Voltaren tablets, tramadol and prednisone.  She returned to Ortho again in April and plan was for possible viscosupplementation injection.  She was to return once approved for the Visco injections however she has been lost to follow-up.  I have referred her back to the orthopedic surgeon today for further evaluation. Radiographs show evidence of joint space narrowing, osteophytes, subchondral sclerosis and/or subchondral cysts. This patient has knee pain which interferes with functional and activities of daily living.  Past Medical History:  Diagnosis Date  . Back pain   . Diabetes mellitus ([place])   . Glaucoma   . Hyperlipidemia   . Menorrhagia 12/17/2011  . No pertinent past medical history     Past Surgical History:  Procedure Laterality Date  . [place]   2x  . MULTIPLE TOOTH EXTRACTIONS      Family History  Problem Relation Age of Onset  .  Cancer Mother        colon  . Cancer Father   . Kidney disease Sister   . Hypertension Brother     Social History   Socioeconomic History  . Marital status: Divorced    Spouse name: Not on file  . Number of children: Not on file  . Years of education: Not on file  . Highest education level: Not on file  Occupational  History  . Not on file  Tobacco Use  . Smoking status: Never Smoker  . Smokeless tobacco: Never Used  Vaping Use  . Vaping Use: Never used  Substance and Sexual Activity  . Alcohol use: No  . Drug use: No  . Sexual activity: Never    Birth control/protection: Abstinence    Comment: last intercourse was 5 yrs ago  Other Topics Concern  . Not on file  Social History Narrative  . Not on file   Social Determinants of Health   Financial Resource Strain:   . Difficulty of Paying Living Expenses:   Food Insecurity:   . Worried About Charity fundraiser in the Last Year:   . Arboriculturist in the Last Year:   Transportation Needs:   . Film/video editor (Medical):   Marland Kitchen Lack of Transportation (Non-Medical):   Physical Activity:   . Days of Exercise per Week:   . Minutes of Exercise per Session:   Stress:   . Feeling of Stress :   Social Connections:   . Frequency of Communication with Friends and Family:   . Frequency of Social Gatherings with Friends and Family:   . Attends Religious Services:   . Active Member of Clubs or Organizations:   . Attends Archivist Meetings:   Marland Kitchen Marital Status:      Observations/Objective: Awake, alert and oriented x 3   Review of Systems  Constitutional: Negative for fever, malaise/fatigue and weight loss.  HENT: Negative.  Negative for nosebleeds.   Eyes: Negative.  Negative for blurred vision, double vision and photophobia.  Respiratory: Negative.  Negative for cough and shortness of breath.   Cardiovascular: Negative.  Negative for chest pain, palpitations and leg swelling.  Gastrointestinal: Negative.  Negative for heartburn, nausea and vomiting.  Musculoskeletal: Positive for joint pain. Negative for myalgias.  Neurological: Negative.  Negative for dizziness, focal weakness, seizures and headaches.  Psychiatric/Behavioral: Negative.  Negative for suicidal ideas.    Assessment and Plan: Roberta Jenkins was seen today for  establish care.  Diagnoses and all orders for this visit:  Encounter to establish care  Chronic pain of right knee Follow-up with orthopedic surgery as instructed.  Type 2 diabetes mellitus with hyperglycemia, with long-term current use of insulin (HCC) Continue Basaglar and Metformin as prescribed Continue blood sugar control as discussed in office today, low carbohydrate diet, and regular physical exercise as tolerated, 150 minutes per week (30 min each day, 5 days per week, or 50 min 3 days per week). Keep blood sugar logs with fasting goal of 90-130 mg/dl, post prandial (after you eat) less than 180.  For Hypoglycemia: BS <60 and Hyperglycemia BS >400; contact the clinic ASAP. Annual eye exams and foot exams are recommended.   Breast cancer screening by mammogram -     MM 3D SCREEN BREAST BILATERAL; Future     Follow Up Instructions Return in about 3 months (around 07/23/2020).     I discussed the assessment and treatment plan with the patient. The patient was  provided an opportunity to ask questions and all were answered. The patient agreed with the plan and demonstrated an understanding of the instructions.   The patient was advised to call back or seek an in-person evaluation if the symptoms worsen or if the condition fails to improve as anticipated.  I provided 17 minutes of non-face-to-face time during this encounter including median intraservice time, reviewing previous notes, labs, imaging, medications and explaining diagnosis and management.  Gildardo Pounds, FNP-BC

## 2020-04-24 ENCOUNTER — Ambulatory Visit (INDEPENDENT_AMBULATORY_CARE_PROVIDER_SITE_OTHER): Payer: 59 | Admitting: Orthopaedic Surgery

## 2020-04-24 ENCOUNTER — Encounter: Payer: Self-pay | Admitting: Orthopaedic Surgery

## 2020-04-24 DIAGNOSIS — M1711 Unilateral primary osteoarthritis, right knee: Secondary | ICD-10-CM | POA: Diagnosis not present

## 2020-04-24 NOTE — Progress Notes (Signed)
Office Visit Note   Patient: Roberta Jenkins           Date of Birth: 07-19-1961           MRN: [id number] Visit Date: 04/24/2020              Requested by: Gildardo Pounds, NP [place],  [place] [id number] PCP: Gildardo Pounds, NP   Assessment & Plan: Visit Diagnoses:  1. Unilateral primary osteoarthritis, right knee     Plan: Impression is ongoing right knee pain without significant relief from prior cortisone injections and Visco injections.  At this point we will need to obtain MRI to further evaluate for structural abnormalities.  Follow-up after the MRI.  Follow-Up Instructions: Return if symptoms worsen or fail to improve.   Orders:  No orders of the defined types were placed in this encounter.  No orders of the defined types were placed in this encounter.     Procedures: No procedures performed   Clinical Data: No additional findings.   Subjective: Chief Complaint  Patient presents with  . Right Knee - Pain    Roberta Jenkins returns today for follow-up of right knee pain.  Continues to have pain and swelling.  She has a lot of popping and giving way.   Review of Systems  Constitutional: Negative.   HENT: Negative.   Eyes: Negative.   Respiratory: Negative.   Cardiovascular: Negative.   Endocrine: Negative.   Musculoskeletal: Negative.   Neurological: Negative.   Hematological: Negative.   Psychiatric/Behavioral: Negative.   All other systems reviewed and are negative.    Objective: Vital Signs: LMP 08/09/2018 (Approximate)   Physical Exam Vitals and nursing note reviewed.  Constitutional:      Appearance: She is well-developed.  Pulmonary:     Effort: Pulmonary effort is normal.  Skin:    General: Skin is warm.     Capillary Refill: Capillary refill takes less than 2 seconds.  Neurological:     Mental Status: She is alert and oriented to person, place, and time.  Psychiatric:        Behavior: Behavior normal.        Thought  Content: Thought content normal.        Judgment: Judgment normal.     Ortho Exam Right knee shows a small joint effusion.  No other findings. Specialty Comments:  No specialty comments available.  Imaging: No results found.   PMFS History: Patient Active Problem List   Diagnosis Date Noted  . Type 2 diabetes mellitus with hyperglycemia ([place]) 12/19/2019  . Fibroid 09/04/2015  . Menorrhagia 12/17/2011   Past Medical History:  Diagnosis Date  . Back pain   . Diabetes mellitus ([place])   . Glaucoma   . Hyperlipidemia   . Menorrhagia 12/17/2011  . No pertinent past medical history     Family History  Problem Relation Age of Onset  . Cancer Mother        colon  . Cancer Father   . Kidney disease Sister   . Hypertension Brother     Past Surgical History:  Procedure Laterality Date  . [place]   2x  . MULTIPLE TOOTH EXTRACTIONS     Social History   Occupational History  . Not on file  Tobacco Use  . Smoking status: Never Smoker  . Smokeless tobacco: Never Used  Vaping Use  . Vaping Use: Never used  Substance and Sexual Activity  .  Alcohol use: No  . Drug use: No  . Sexual activity: Never    Birth control/protection: Abstinence    Comment: last intercourse was 5 yrs ago

## 2020-04-25 ENCOUNTER — Other Ambulatory Visit: Payer: Self-pay

## 2020-04-25 ENCOUNTER — Emergency Department (HOSPITAL_COMMUNITY)
Admission: EM | Admit: 2020-04-25 | Discharge: 2020-04-26 | Disposition: A | Discharge status: Home / Self Care | Payer: 59 | Attending: Emergency Medicine | Admitting: Emergency Medicine

## 2020-04-25 ENCOUNTER — Encounter (HOSPITAL_COMMUNITY): Payer: Self-pay | Admitting: Emergency Medicine

## 2020-04-25 DIAGNOSIS — Z5321 Procedure and treatment not carried out due to patient leaving prior to being seen by health care provider: Secondary | ICD-10-CM | POA: Insufficient documentation

## 2020-04-25 DIAGNOSIS — K921 Melena: Secondary | ICD-10-CM | POA: Insufficient documentation

## 2020-04-25 DIAGNOSIS — M1711 Unilateral primary osteoarthritis, right knee: Secondary | ICD-10-CM

## 2020-04-25 LAB — COMPREHENSIVE METABOLIC PANEL
ALT: 22 U/L (ref 0–44)
AST: 19 U/L (ref 15–41)
Albumin: 3.8 g/dL (ref 3.5–5.0)
Alkaline Phosphatase: 51 U/L (ref 38–126)
Anion gap: 10 (ref 5–15)
BUN: 9 mg/dL (ref 6–20)
CO2: 23 mmol/L (ref 22–32)
Calcium: 9 mg/dL (ref 8.9–10.3)
Chloride: 104 mmol/L (ref 98–111)
Creatinine, Ser: 0.78 mg/dL (ref 0.44–1.00)
GFR calc Af Amer: >60 mL/min (ref >60)
GFR calc non Af Amer: >60 mL/min (ref >60)
Glucose, Bld: 104 mg/dL — ABNORMAL HIGH (ref 70–99)
Potassium: 3.9 mmol/L (ref 3.5–5.1)
Sodium: 137 mmol/L (ref 135–145)
Total Bilirubin: 0.4 mg/dL (ref 0.3–1.2)
Total Protein: 7.2 g/dL (ref 6.5–8.1)

## 2020-04-25 LAB — CBC
HCT: 35.1 % — ABNORMAL LOW (ref 36.0–46.0)
Hemoglobin: 10.8 g/dL — ABNORMAL LOW (ref 12.0–15.0)
MCH: 28 pg (ref 26.0–34.0)
MCHC: 30.8 g/dL (ref 30.0–36.0)
MCV: 90.9 fL (ref 80.0–100.0)
Platelets: 337 10*3/uL (ref 150–400)
RBC: 3.86 MIL/uL — ABNORMAL LOW (ref 3.87–5.11)
RDW: 13.2 % (ref 11.5–15.5)
WBC: 7.6 10*3/uL (ref 4.0–10.5)
nRBC: 0 % (ref 0.0–0.2)

## 2020-04-25 LAB — ABO/RH: ABO/RH(D): A POS

## 2020-04-25 NOTE — ED Triage Notes (Signed)
Pt presents to ED PV. Pt c/o bright red blood in stool. Pt also states that her stool is darker brown. No diarrhea, no blood thinner, no abd pain. Pt NAD.

## 2020-04-26 ENCOUNTER — Encounter (HOSPITAL_COMMUNITY): Payer: Self-pay | Admitting: Emergency Medicine

## 2020-04-26 ENCOUNTER — Emergency Department (HOSPITAL_COMMUNITY)
Admission: EM | Admit: 2020-04-26 | Discharge: 2020-04-26 | Disposition: A | Discharge status: Home / Self Care | Payer: 59 | Source: Home / Self Care | Attending: Emergency Medicine | Admitting: Emergency Medicine

## 2020-04-26 ENCOUNTER — Other Ambulatory Visit: Payer: Self-pay

## 2020-04-26 ENCOUNTER — Emergency Department (HOSPITAL_COMMUNITY): Payer: 59

## 2020-04-26 DIAGNOSIS — K625 Hemorrhage of anus and rectum: Secondary | ICD-10-CM

## 2020-04-26 DIAGNOSIS — Z79899 Other long term (current) drug therapy: Secondary | ICD-10-CM | POA: Insufficient documentation

## 2020-04-26 DIAGNOSIS — Z794 Long term (current) use of insulin: Secondary | ICD-10-CM | POA: Insufficient documentation

## 2020-04-26 DIAGNOSIS — R1032 Left lower quadrant pain: Secondary | ICD-10-CM | POA: Insufficient documentation

## 2020-04-26 DIAGNOSIS — E119 Type 2 diabetes mellitus without complications: Secondary | ICD-10-CM | POA: Insufficient documentation

## 2020-04-26 LAB — COMPREHENSIVE METABOLIC PANEL
ALT: 22 U/L (ref 0–44)
AST: 17 U/L (ref 15–41)
Albumin: 3.5 g/dL (ref 3.5–5.0)
Alkaline Phosphatase: 51 U/L (ref 38–126)
Anion gap: 10 (ref 5–15)
BUN: 7 mg/dL (ref 6–20)
CO2: 24 mmol/L (ref 22–32)
Calcium: 9.1 mg/dL (ref 8.9–10.3)
Chloride: 106 mmol/L (ref 98–111)
Creatinine, Ser: 0.71 mg/dL (ref 0.44–1.00)
GFR calc Af Amer: >60 mL/min (ref >60)
GFR calc non Af Amer: >60 mL/min (ref >60)
Glucose, Bld: 91 mg/dL (ref 70–99)
Potassium: 3.9 mmol/L (ref 3.5–5.1)
Sodium: 140 mmol/L (ref 135–145)
Total Bilirubin: 0.8 mg/dL (ref 0.3–1.2)
Total Protein: 6.7 g/dL (ref 6.5–8.1)

## 2020-04-26 LAB — CBC
HCT: 36 % (ref 36.0–46.0)
Hemoglobin: 10.9 g/dL — ABNORMAL LOW (ref 12.0–15.0)
MCH: 27.6 pg (ref 26.0–34.0)
MCHC: 30.3 g/dL (ref 30.0–36.0)
MCV: 91.1 fL (ref 80.0–100.0)
Platelets: 337 10*3/uL (ref 150–400)
RBC: 3.95 MIL/uL (ref 3.87–5.11)
RDW: 13.1 % (ref 11.5–15.5)
WBC: 6 10*3/uL (ref 4.0–10.5)
nRBC: 0 % (ref 0.0–0.2)

## 2020-04-26 LAB — POC OCCULT BLOOD, ED: Fecal Occult Bld: POSITIVE — AB

## 2020-04-26 LAB — TYPE AND SCREEN
ABO/RH(D): A POS
Antibody Screen: NEGATIVE

## 2020-04-26 MED ORDER — IOHEXOL 300 MG/ML  SOLN
100.0000 mL | Freq: Once | INTRAMUSCULAR | Status: AC | PRN
  Administered 2020-04-26: 100 mL via INTRAVENOUS

## 2020-04-26 NOTE — ED Notes (Signed)
Pt seen leaving by registration

## 2020-04-26 NOTE — ED Notes (Signed)
Registration stated pt would not finalize registration and stated she was leaving and was seen exiting the ED

## 2020-04-26 NOTE — ED Triage Notes (Addendum)
C/o 1 episode of bright red rectal bleeding last night.  Denies abd pain.  Denies diarrhea.  No blood thinner.  Pt came to ED last night and LWBS due to wait.  See labs from yesterday.

## 2020-04-26 NOTE — ED Provider Notes (Signed)
[facility] EMERGENCY DEPARTMENT Provider Note   CSN: [id number] Arrival date & time: 04/26/20  0730     History Chief Complaint  Patient presents with  . Rectal Bleeding    Roberta Jenkins is a 59 y.o. female.  HPI   59 year old female history of diabetes presents today complaining of rectal bleeding.  She states she had a bowel movement last night that had stool also noted some blood in this.  She denies any pain.  She denies any similar symptoms in the past.  She denies any previous gastroenterological work-up.  She is not on any blood thinners.  She reports that she had one episode that was similar to this last week but has had no other episodes in the interim.  Past Medical History:  Diagnosis Date  . Back pain   . Diabetes mellitus ([place])   . Glaucoma   . Hyperlipidemia   . Menorrhagia 12/17/2011  . No pertinent past medical history     Patient Active Problem List   Diagnosis Date Noted  . Type 2 diabetes mellitus with hyperglycemia ([place]) 12/19/2019  . Fibroid 09/04/2015  . Menorrhagia 12/17/2011    Past Surgical History:  Procedure Laterality Date  . [place]   2x  . MULTIPLE TOOTH EXTRACTIONS       OB History    Gravida  4   Para  3   Term  3   Preterm      AB  1   Living  3     SAB  1   TAB      Ectopic      Multiple      Live Births              Family History  Problem Relation Age of Onset  . Cancer Mother        colon  . Cancer Father   . Kidney disease Sister   . Hypertension Brother     Social History   Tobacco Use  . Smoking status: Never Smoker  . Smokeless tobacco: Never Used  Vaping Use  . Vaping Use: Never used  Substance Use Topics  . Alcohol use: No  . Drug use: No    Home Medications Prior to Admission medications   Medication Sig Start Date End Date Taking? Authorizing Provider  acetaminophen (TYLENOL) 325 MG tablet Take 2 tablets (650 mg total) by mouth every  6 (six) hours as needed. 12/23/19  Yes Caccavale, Sophia, PA-C  atorvastatin (LIPITOR) 20 MG tablet Take 1 tablet (20 mg total) by mouth daily. 03/27/20  Yes Argentina Donovan, PA-C  diclofenac (VOLTAREN) 75 MG EC tablet Take 1 tablet (75 mg total) by mouth 2 (two) times daily. Patient taking differently: Take 75 mg by mouth 2 (two) times daily as needed for mild pain.  02/26/20  Yes Aundra Dubin, PA-C  Insulin Glargine (BASAGLAR KWIKPEN) 100 UNIT/ML SOPN Inject 0.18 mLs (18 Units total) into the skin daily. 12/19/19 04/26/20 Yes McClung, Dionne Bucy, PA-C  metFORMIN (GLUCOPHAGE) 500 MG tablet Take 2 tablets (1,000 mg total) by mouth 2 (two) times daily. Patient taking differently: Take 1,000 mg by mouth daily with breakfast.  01/09/20 05/08/20 Yes Charlott Rakes, MD  Multiple Vitamin (MULTIVITAMIN WITH MINERALS) TABS tablet Take 1 tablet by mouth daily.   Yes [provider]  traMADol (ULTRAM) 50 MG tablet Take 1 tablet (50 mg total) by mouth 3 (three) times daily as  needed. 02/26/20  Yes Dwana Melena L, PA-C  Travoprost, BAK Free, (TRAVATAN Z) 0.004 % SOLN ophthalmic solution Place 1 drop into both eyes at bedtime. Samples   Yes [provider]  blood glucose meter kit and supplies KIT Dispense based on patient and insurance preference. Use up to four times daily as directed. (FOR ICD-9 250.00, 250.01). 11/28/19   Wieters, Hallie C, PA-C  celecoxib (CELEBREX) 200 MG capsule Take 1 capsule (200 mg total) by mouth 2 (two) times daily. X 1 week then one daily prn pain Patient not taking: Reported on 04/26/2020 12/19/19   Argentina Donovan, PA-C  cyclobenzaprine (FLEXERIL) 5 MG tablet Take 1 tablet (5 mg total) by mouth at bedtime. Patient not taking: Reported on 04/26/2020 11/28/19   Wieters, Madelynn Done C, PA-C  medroxyPROGESTERone (PROVERA) 10 MG tablet Take 1 tablet (10 mg total) by mouth daily. Patient not taking: Reported on 0/53/9767 34/1/93   Delora Fuel, MD    Allergies    Patient has  no known allergies.  Review of Systems   Review of Systems  All other systems reviewed and are negative.   Physical Exam Updated Vital Signs BP 115/70 (BP Location: Right Arm)   Pulse 62   Temp 98.3 F (36.8 C) (Oral)   Resp 18   Ht 1.727 m ('5\' 8"'$ )   Wt 74.8 kg   LMP 08/09/2018 (Approximate)   SpO2 99%   BMI 25.09 kg/m   Physical Exam Vitals and nursing note reviewed.  Constitutional:      General: She is not in acute distress.    Appearance: Normal appearance. She is normal weight. She is not ill-appearing.  HENT:     Head: Normocephalic and atraumatic.     Right Ear: External ear normal.     Left Ear: External ear normal.     Nose: Nose normal.     Mouth/Throat:     Mouth: Mucous membranes are moist.  Eyes:     Pupils: Pupils are equal, round, and reactive to light.  Cardiovascular:     Rate and Rhythm: Normal rate and regular rhythm.     Pulses: Normal pulses.     Heart sounds: Normal heart sounds.  Pulmonary:     Effort: Pulmonary effort is normal.     Breath sounds: Normal breath sounds.  Abdominal:     General: Abdomen is flat. Bowel sounds are normal.     Palpations: Abdomen is soft.     Tenderness: There is abdominal tenderness.     Comments: Tenderness is noted to the left lower quadrant  Musculoskeletal:        General: Normal range of motion.  Skin:    General: Skin is warm and dry.     Capillary Refill: Capillary refill takes less than 2 seconds.  Neurological:     General: No focal deficit present.     Mental Status: She is alert and oriented to person, place, and time. Mental status is at baseline.     Cranial Nerves: No cranial nerve deficit.  Psychiatric:        Mood and Affect: Mood normal.     ED Results / Procedures / Treatments   Labs (all labs ordered are listed, but only abnormal results are displayed) Labs Reviewed  CBC - Abnormal; Notable for the following components:      Result Value   Hemoglobin 10.9 (*)    All other  components within normal limits  POC OCCULT BLOOD, ED -  Abnormal; Notable for the following components:   Fecal Occult Bld POSITIVE (*)    All other components within normal limits  COMPREHENSIVE METABOLIC PANEL  URINALYSIS, DIPSTICK ONLY  OCCULT BLOOD X 1 CARD TO LAB, STOOL  TYPE AND SCREEN    EKG None  Radiology No results found.  Procedures Procedures (including critical care time)  Medications Ordered in ED Medications - No data to display  ED Course  I have reviewed the triage vital signs and the nursing notes.  Pertinent labs & imaging results that were available during my care of the patient were reviewed by me and considered in my medical decision making (see chart for details).  Clinical Course as of Apr 28 1242  Sat Apr 26, 2020  1120 Labs reviewed and Hemoccult positive, however there was very little stool was not grossly negativeHemoglobin is 10.9 and stable from priorNo active bleeding here   [DR]  1120 CT reviewed and radiologist interpretation reviewedUterine fibroid noted   [DR]  1121 Circumferential thickening of the antrum questioned on CT.  However, there is no clinical correlation here as patient is nontender in this area.   [DR]    Clinical Course User Index [DR] Pattricia Boss, MD   MDM Rules/Calculators/A&P                          Patient presented today with an episode of rectal bleeding.  She noted blood after stooling.  Here she is hemodynamically stable.  She has had no repeat episode of this.  Hemoccult was reported as positive, however there was little if any stool noted on digital exam.  There is certainly no gross blood noted.  I have discussed return precautions including returning of bleeding.  Plan is for referral to GI as patient will need further evaluation.  She voices understanding of return precautions and need for follow-up  Final Clinical Impression(s) / ED Diagnoses Final diagnoses:  Rectal bleeding    Rx / DC Orders ED  Discharge Orders    None       Pattricia Boss, MD 04/28/20 1244

## 2020-04-26 NOTE — Discharge Instructions (Signed)
Your work-up today shows a stable red blood cell count although somewhat low CT scan showed a fibroid of the uterus.  This is usually a chronic condition.  However, you should have this followed up with your gynecologist Please call the gastroenterologist on Monday for follow-up.  If you have worsening rectal bleeding, return for reevaluation

## 2020-04-27 LAB — TYPE AND SCREEN
ABO/RH(D): A POS
Antibody Screen: NEGATIVE

## 2020-05-09 ENCOUNTER — Other Ambulatory Visit: Payer: Self-pay

## 2020-05-09 ENCOUNTER — Ambulatory Visit
Admission: RE | Admit: 2020-05-09 | Discharge: 2020-05-09 | Disposition: A | Discharge status: Home / Self Care | Payer: 59 | Source: Ambulatory Visit | Attending: Nurse Practitioner | Admitting: Nurse Practitioner

## 2020-05-09 DIAGNOSIS — Z1231 Encounter for screening mammogram for malignant neoplasm of breast: Secondary | ICD-10-CM

## 2020-05-16 ENCOUNTER — Other Ambulatory Visit: Payer: Self-pay | Admitting: Pharmacist

## 2020-05-16 DIAGNOSIS — E1165 Type 2 diabetes mellitus with hyperglycemia: Secondary | ICD-10-CM

## 2020-05-16 MED ORDER — ONETOUCH VERIO VI STRP: ORAL_STRIP | 12 refills | Status: DC

## 2020-05-16 MED ORDER — TRUEPLUS 5-BEVEL PEN NEEDLES 32G X 4 MM MISC: 2 refills | Status: DC

## 2020-05-16 MED ORDER — ONETOUCH DELICA PLUS LANCET33G MISC: 2 refills | Status: DC

## 2020-05-28 ENCOUNTER — Other Ambulatory Visit: Payer: Self-pay

## 2020-05-28 ENCOUNTER — Encounter: Payer: Self-pay | Admitting: Internal Medicine

## 2020-05-28 ENCOUNTER — Ambulatory Visit: Payer: 59 | Attending: Nurse Practitioner | Admitting: Internal Medicine

## 2020-05-28 VITALS — BP 110/72 | HR 60 | Temp 96.4°F | Resp 17 | Wt 168.0 lb

## 2020-05-28 DIAGNOSIS — Z794 Long term (current) use of insulin: Secondary | ICD-10-CM

## 2020-05-28 DIAGNOSIS — M25561 Pain in right knee: Secondary | ICD-10-CM

## 2020-05-28 DIAGNOSIS — E1165 Type 2 diabetes mellitus with hyperglycemia: Secondary | ICD-10-CM

## 2020-05-28 DIAGNOSIS — G8929 Other chronic pain: Secondary | ICD-10-CM | POA: Diagnosis not present

## 2020-05-28 NOTE — Progress Notes (Signed)
Right knee pain- extensive evaluation. Has had steroid and visco injections without reief, scheduled for MRI. Pain especially when walking up stairs.   Had dm labs in May- she states she started lipitor at that time.  Past Medical History:  Diagnosis Date  . Back pain   . Diabetes mellitus ([place])   . Glaucoma   . Hyperlipidemia   . Menorrhagia 12/17/2011  . No pertinent past medical history     Social History   Socioeconomic History  . Marital status: Divorced    Spouse name: Not on file  . Number of children: Not on file  . Years of education: Not on file  . Highest education level: Not on file  Occupational History  . Not on file  Tobacco Use  . Smoking status: Never Smoker  . Smokeless tobacco: Never Used  Vaping Use  . Vaping Use: Never used  Substance and Sexual Activity  . Alcohol use: No  . Drug use: No  . Sexual activity: Never    Birth control/protection: Abstinence    Comment: last intercourse was 5 yrs ago  Other Topics Concern  . Not on file  Social History Narrative  . Not on file   Social Determinants of Health   Financial Resource Strain:   . Difficulty of Paying Living Expenses:   Food Insecurity:   . Worried About Charity fundraiser in the Last Year:   . Arboriculturist in the Last Year:   Transportation Needs:   . Film/video editor (Medical):   Marland Kitchen Lack of Transportation (Non-Medical):   Physical Activity:   . Days of Exercise per Week:   . Minutes of Exercise per Session:   Stress:   . Feeling of Stress :   Social Connections:   . Frequency of Communication with Friends and Family:   . Frequency of Social Gatherings with Friends and Family:   . Attends Religious Services:   . Active Member of Clubs or Organizations:   . Attends Archivist Meetings:   Marland Kitchen Marital Status:   Intimate Partner Violence:   . Fear of Current or Ex-Partner:   . Emotionally Abused:   Marland Kitchen Physically Abused:   . Sexually Abused:     Past Surgical  History:  Procedure Laterality Date  . [place]   2x  . MULTIPLE TOOTH EXTRACTIONS      Family History  Problem Relation Age of Onset  . Cancer Mother        colon  . Cancer Father   . Kidney disease Sister   . Hypertension Brother     No Known Allergies  Current Outpatient Medications on File Prior to Visit  Medication Sig Dispense Refill  . atorvastatin (LIPITOR) 20 MG tablet Take 1 tablet (20 mg total) by mouth daily. 90 tablet 3  . diclofenac (VOLTAREN) 75 MG EC tablet Take 1 tablet (75 mg total) by mouth 2 (two) times daily. (Patient taking differently: Take 75 mg by mouth 2 (two) times daily as needed for mild pain. ) 60 tablet 2  . glucose blood (ONETOUCH VERIO) test strip Use to check blood sugar TID. E11.65 100 each 12  . Insulin Glargine (BASAGLAR KWIKPEN) 100 UNIT/ML SOPN Inject 0.18 mLs (18 Units total) into the skin daily. 5.4 mL 3  . Insulin Pen Needle (TRUEPLUS 5-BEVEL PEN NEEDLES) 32G X 4 MM MISC Use to inject Basaglar once daily. E11.65 100 each 2  . Lancets [facility]  PLUS [id number]) MISC Use to check blood sugar TID. E11.65 100 each 2  . latanoprost (XALATAN) 0.005 % ophthalmic solution SMARTSIG:1 Drop(s) In Eye(s) Every Evening    . Multiple Vitamin (MULTIVITAMIN WITH MINERALS) TABS tablet Take 1 tablet by mouth daily.    . traMADol (ULTRAM) 50 MG tablet Take 1 tablet (50 mg total) by mouth 3 (three) times daily as needed. 30 tablet 2  . Travoprost, BAK Free, (TRAVATAN Z) 0.004 % SOLN ophthalmic solution Place 1 drop into both eyes at bedtime. Samples    . metFORMIN (GLUCOPHAGE) 500 MG tablet Take 2 tablets (1,000 mg total) by mouth 2 (two) times daily. (Patient taking differently: Take 1,000 mg by mouth daily with breakfast. ) 120 tablet 3   No current facility-administered medications on file prior to visit.     patient denies chest pain, shortness of breath, orthopnea. Denies lower extremity edema, abdominal pain, change in appetite,  change in bowel movements. Patient denies rashes, musculoskeletal complaints. No other specific complaints in a complete review of systems.   BP 110/72   Pulse 60   Temp (!) 96.4 F (35.8 C) (Temporal)   Resp 17   Wt 168 lb (76.2 kg)   LMP 08/09/2018 (Approximate)   SpO2 98%   BMI 25.54 kg/m   Well-developed well-nourished female in no acute distress. HEENT exam atraumatic, normocephalic, extraocular muscles are intact. Neck is supple. No jugular venous distention no thyromegaly. Chest clear to auscultation without increased work of breathing. Cardiac exam S1 and S2 are regular. Abdominal exam active bowel sounds, soft, nontender. Extremities no edema. Neurologic exam she is alert without any motor sensory deficits. Gait is normal. Right joint (knee) effusion.  A?P- knee pain- f/uafter MRI  DM - controlled.

## 2020-05-28 NOTE — Assessment & Plan Note (Signed)
Lab Results  Component Value Date   HGBA1C 5.4 03/26/2020   CBG (last 3)  No results for input(s): GLUCAP in the last 72 hours.  Lab Results  Component Value Date   LABMICR <3.0 01/16/2020     Lab Results  Component Value Date   CHOL 251 (H) 03/26/2020   HDL 53 03/26/2020   LDLCALC 162 (H) 03/26/2020   TRIG 199 (H) 03/26/2020   CHOLHDL 4.7 (H) 03/26/2020

## 2020-05-31 ENCOUNTER — Ambulatory Visit
Admission: RE | Admit: 2020-05-31 | Discharge: 2020-05-31 | Disposition: A | Discharge status: Home / Self Care | Payer: 59 | Source: Ambulatory Visit | Attending: Orthopaedic Surgery | Admitting: Orthopaedic Surgery

## 2020-05-31 DIAGNOSIS — M1711 Unilateral primary osteoarthritis, right knee: Secondary | ICD-10-CM

## 2020-06-03 ENCOUNTER — Encounter: Payer: Self-pay | Admitting: Orthopaedic Surgery

## 2020-06-03 ENCOUNTER — Ambulatory Visit (INDEPENDENT_AMBULATORY_CARE_PROVIDER_SITE_OTHER): Payer: 59

## 2020-06-03 ENCOUNTER — Ambulatory Visit (INDEPENDENT_AMBULATORY_CARE_PROVIDER_SITE_OTHER): Payer: 59 | Admitting: Orthopaedic Surgery

## 2020-06-03 VITALS — Ht 67.0 in | Wt 169.4 lb

## 2020-06-03 DIAGNOSIS — G8929 Other chronic pain: Secondary | ICD-10-CM | POA: Diagnosis not present

## 2020-06-03 DIAGNOSIS — M25561 Pain in right knee: Secondary | ICD-10-CM

## 2020-06-03 DIAGNOSIS — M25512 Pain in left shoulder: Secondary | ICD-10-CM

## 2020-06-03 MED ORDER — MELOXICAM 7.5 MG PO TABS: 7.5000 mg | ORAL_TABLET | Freq: Two times a day (BID) | ORAL | 2 refills | Status: DC | PRN

## 2020-06-03 NOTE — Progress Notes (Signed)
   Office Visit Note   Patient: Jasiel Apachito           Date of Birth: 1961-08-06           MRN: [id number] Visit Date: 06/03/2020              Requested by: Gildardo Pounds, NP [place],  [place] [id number] PCP: Gildardo Pounds, NP   Assessment & Plan: Visit Diagnoses:  1. Chronic pain of right knee   2. 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  She does have degenerative anterior lateral meniscal tear.  Based on her report of her symptoms my impression is that the lateral meniscal tear is not responsible for her pain.  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  She has had cortisone and Visco injections with temporary relief.  Based on our discussion she would like to try meloxicam.  Questions encouraged and answered.  Follow-up as needed.  Follow-Up Instructions: Return if symptoms worsen or fail to improve.   Orders:  Orders Placed This Encounter  Procedures  . XR Shoulder Left   Meds ordered this encounter  Medications  . meloxicam (MOBIC) 7.5 MG tablet    Sig: Take 1 tablet (7.5 mg total) by mouth 2 (two) times daily as needed for pain.    Dispense:  30 tablet    Refill:  2      Procedures: No procedures performed   Clinical Data: No additional findings.   Subjective: Chief Complaint  Patient presents with  . Left Shoulder - Pain  . Right Knee - Pain    Kelvin is here for MRI review of the right knee.   Review of Systems   Objective: Vital Signs: Ht 5\' 7"  (1.702 m)   Wt 169 lb 6.4 oz (76.8 kg)   LMP 08/09/2018 (Approximate)   BMI 26.53 kg/m   Physical Exam  Ortho Exam Right knee exam is unchanged. Specialty Comments:  No specialty comments available.  Imaging: XR Shoulder Left  Result Date: 06/03/2020 No acute or structural abnormalities    PMFS History: Patient Active Problem List   Diagnosis Date Noted  . Type 2 diabetes mellitus  with hyperglycemia ([place]) 12/19/2019  . Fibroid 09/04/2015  . Menorrhagia 12/17/2011   Past Medical History:  Diagnosis Date  . Back pain   . Diabetes mellitus ([place])   . Glaucoma   . Hyperlipidemia   . Menorrhagia 12/17/2011  . No pertinent past medical history     Family History  Problem Relation Age of Onset  . Cancer Mother        colon  . Cancer Father   . Kidney disease Sister   . Hypertension Brother     Past Surgical History:  Procedure Laterality Date  . [place]   2x  . MULTIPLE TOOTH EXTRACTIONS     Social History   Occupational History  . Not on file  Tobacco Use  . Smoking status: Never Smoker  . Smokeless tobacco: Never Used  Vaping Use  . Vaping Use: Never used  Substance and Sexual Activity  . Alcohol use: No  . Drug use: No  . Sexual activity: Never    Birth control/protection: Abstinence    Comment: last intercourse was 5 yrs ago

## 2020-07-23 ENCOUNTER — Encounter: Payer: Self-pay | Admitting: Nurse Practitioner

## 2020-07-23 ENCOUNTER — Ambulatory Visit: Payer: 59 | Attending: Nurse Practitioner | Admitting: Nurse Practitioner

## 2020-07-23 ENCOUNTER — Other Ambulatory Visit: Payer: Self-pay

## 2020-07-23 VITALS — BP 105/67 | HR 72 | Temp 97.7°F | Ht 67.0 in | Wt 175.0 lb

## 2020-07-23 DIAGNOSIS — Z794 Long term (current) use of insulin: Secondary | ICD-10-CM | POA: Diagnosis not present

## 2020-07-23 DIAGNOSIS — E785 Hyperlipidemia, unspecified: Secondary | ICD-10-CM

## 2020-07-23 DIAGNOSIS — E1165 Type 2 diabetes mellitus with hyperglycemia: Secondary | ICD-10-CM

## 2020-07-23 DIAGNOSIS — M25561 Pain in right knee: Secondary | ICD-10-CM

## 2020-07-23 DIAGNOSIS — Z1211 Encounter for screening for malignant neoplasm of colon: Secondary | ICD-10-CM

## 2020-07-23 DIAGNOSIS — G8929 Other chronic pain: Secondary | ICD-10-CM

## 2020-07-23 DIAGNOSIS — Z1159 Encounter for screening for other viral diseases: Secondary | ICD-10-CM

## 2020-07-23 LAB — GLUCOSE, POCT (MANUAL RESULT ENTRY): POC Glucose: 120 mg/dl — AB (ref 70–99)

## 2020-07-23 MED ORDER — MELOXICAM 7.5 MG PO TABS: 7.5000 mg | ORAL_TABLET | Freq: Two times a day (BID) | ORAL | 2 refills | Status: AC

## 2020-07-23 MED ORDER — BASAGLAR KWIKPEN 100 UNIT/ML ~~LOC~~ SOPN: 18.0000 [IU] | PEN_INJECTOR | Freq: Every day | SUBCUTANEOUS | 3 refills | Status: DC

## 2020-07-23 MED ORDER — ONETOUCH VERIO VI STRP: ORAL_STRIP | 12 refills | Status: DC

## 2020-07-23 MED ORDER — ATORVASTATIN CALCIUM 20 MG PO TABS: 20.0000 mg | ORAL_TABLET | Freq: Every day | ORAL | 3 refills | Status: DC

## 2020-07-23 MED ORDER — ONETOUCH DELICA PLUS LANCET33G MISC: 2 refills | Status: DC

## 2020-07-23 MED ORDER — METFORMIN HCL 500 MG PO TABS: 1000.0000 mg | ORAL_TABLET | Freq: Every day | ORAL | 3 refills | Status: DC

## 2020-07-23 NOTE — Progress Notes (Signed)
Assessment & Plan:  Roberta Jenkins was seen today for knee pain.  Diagnoses and all orders for this visit:  Type 2 diabetes mellitus with hyperglycemia, with long-term current use of insulin (HCC) -     Glucose (CBG) -     metFORMIN (GLUCOPHAGE) 500 MG tablet; Take 2 tablets (1,000 mg total) by mouth daily with breakfast. -     Insulin Glargine (BASAGLAR KWIKPEN) 100 UNIT/ML; Inject 18 Units into the skin daily. -     Lancets (ONETOUCH DELICA PLUS [id number]) MISC; Use to check blood sugar TID. E11.65 -     glucose blood (ONETOUCH VERIO) test strip; Use to check blood sugar TID. E11.65 -     Hemoglobin A1c Controlled Continue medications as prescribed.  Continue blood sugar control as discussed in office today, low carbohydrate diet, and regular physical exercise as tolerated, 150 minutes per week (30 min each day, 5 days per week, or 50 min 3 days per week). Keep blood sugar logs with fasting goal of 90-130 mg/dl, post prandial (after you eat) less than 180.  For Hypoglycemia: BS <60 and Hyperglycemia BS >400; contact the clinic ASAP. Annual eye exams and foot exams are recommended.   Hyperlipidemia, unspecified hyperlipidemia type -     atorvastatin (LIPITOR) 20 MG tablet; Take 1 tablet (20 mg total) by mouth daily. -     Lipid panel INSTRUCTIONS: Work on a low fat, heart healthy diet and participate in regular aerobic exercise program by working out at least 150 minutes per week; 5 days a week-30 minutes per day. Avoid red meat/beef/steak,  fried foods. junk foods, sodas, sugary drinks, unhealthy snacking, alcohol and smoking.  Drink at least 80 oz of water per day and monitor your carbohydrate intake daily.    Colon cancer screening -     Fecal occult blood, imunochemical([facility])  Need for hepatitis C screening test -     Hepatitis C Antibody  Chronic pain of right knee -     meloxicam (MOBIC) 7.5 MG tablet; Take 1 tablet (7.5 mg total) by mouth 2 (two) times daily. She was  instructed to follow-up with orthopedic surgery and meloxicam twice a day is ineffective. Work on losing weight to help reduce joint pain. May alternate with heat and ice application for pain relief. May also alternate with acetaminophen  as prescribed pain relief. Other alternatives include massage, acupuncture and water aerobics.  You must stay active and avoid a sedentary lifestyle.   Patient has been counseled on age-appropriate routine health concerns for screening and prevention. These are reviewed and up-to-date. Referrals have been placed accordingly. Immunizations are up-to-date or declined.    Subjective:   Chief Complaint  Patient presents with  . Knee Pain    Pt. is here for right knee pain. Pt. stated it has been going on for a months.    HPI Roberta Jenkins 59 y.o. female presents to office today with complaints of chronic right knee pain and swelling.  She is current being managed by orthopedics for this and was most recently prescribed meloxicam 7.5 mg twice daily as needed.  Today she states she has been taking meloxicam when her pain is severe which is several days out of the week and does not feel it is very effective.  I have instructed her to try meloxicam 7.5 mg twice daily on a schedule and if this is ineffective she will need to follow-up with orthopedics.  DM TYPE 2 She has been out of [employer]  and Metformin for well over a month and today's blood glucose level in the office is 120.  At this time I will only resume her Metformin 1000 mg daily and based on blood work results will determine if insulin needs to be restarted.  She denies any symptoms of hypo or hyperglycemia.  LDL is not at goal although she does endorse medication adherence taking atorvastatin 20 mg daily.  Denies any statin intolerance or myalgia. Lab Results  Component Value Date   HGBA1C 5.4 03/26/2020   Lab Results  Component Value Date   LDLCALC 162 (H) 03/26/2020   Review of Systems    Constitutional: Negative for fever, malaise/fatigue and weight loss.  HENT: Negative.  Negative for nosebleeds.   Eyes: Negative.  Negative for blurred vision, double vision and photophobia.  Respiratory: Negative.  Negative for cough and shortness of breath.   Cardiovascular: Negative.  Negative for chest pain, palpitations and leg swelling.  Gastrointestinal: Negative.  Negative for heartburn, nausea and vomiting.  Musculoskeletal: Positive for joint pain. Negative for myalgias.  Neurological: Negative.  Negative for dizziness, focal weakness, seizures and headaches.  Psychiatric/Behavioral: Negative.  Negative for suicidal ideas.    Past Medical History:  Diagnosis Date  . Back pain   . Diabetes mellitus ([place])   . Glaucoma   . Hyperlipidemia   . Menorrhagia 12/17/2011  . No pertinent past medical history     Past Surgical History:  Procedure Laterality Date  . [place]   2x  . MULTIPLE TOOTH EXTRACTIONS      Family History  Problem Relation Age of Onset  . Cancer Mother        colon  . Cancer Father   . Kidney disease Sister   . Hypertension Brother     Social History Reviewed with no changes to be made today.   Outpatient Medications Prior to Visit  Medication Sig Dispense Refill  . Insulin Pen Needle (TRUEPLUS 5-BEVEL PEN NEEDLES) 32G X 4 MM MISC Use to inject Basaglar once daily. E11.65 100 each 2  . latanoprost (XALATAN) 0.005 % ophthalmic solution SMARTSIG:1 Drop(s) In Eye(s) Every Evening    . Multiple Vitamin (MULTIVITAMIN WITH MINERALS) TABS tablet Take 1 tablet by mouth daily.    . Travoprost, BAK Free, (TRAVATAN Z) 0.004 % SOLN ophthalmic solution Place 1 drop into both eyes at bedtime. Samples    . atorvastatin (LIPITOR) 20 MG tablet Take 1 tablet (20 mg total) by mouth daily. 90 tablet 3  . glucose blood (ONETOUCH VERIO) test strip Use to check blood sugar TID. E11.65 100 each 12  . Insulin Glargine (BASAGLAR KWIKPEN) 100 UNIT/ML SOPN  Inject 0.18 mLs (18 Units total) into the skin daily. 5.4 mL 3  . Lancets (ONETOUCH DELICA PLUS [id number]) MISC Use to check blood sugar TID. E11.65 100 each 2  . meloxicam (MOBIC) 7.5 MG tablet Take 1 tablet (7.5 mg total) by mouth 2 (two) times daily as needed for pain. 30 tablet 2  . traMADol (ULTRAM) 50 MG tablet Take 1 tablet (50 mg total) by mouth 3 (three) times daily as needed. (Patient not taking: Reported on 07/23/2020) 30 tablet 2  . diclofenac (VOLTAREN) 75 MG EC tablet Take 1 tablet (75 mg total) by mouth 2 (two) times daily. (Patient not taking: Reported on 07/23/2020) 60 tablet 2  . metFORMIN (GLUCOPHAGE) 500 MG tablet Take 2 tablets (1,000 mg total) by mouth 2 (two) times daily. (Patient taking differently: Take 1,000 mg  by mouth daily with breakfast. ) 120 tablet 3   No facility-administered medications prior to visit.    No Known Allergies     Objective:    BP 105/67 (BP Location: Right Arm, Patient Position: Sitting, Cuff Size: Normal)   Pulse 72   Temp 97.7 F (36.5 C) (Temporal)   Ht 5\' 7"  (1.702 m)   Wt 175 lb (79.4 kg)   LMP 08/09/2018 (Approximate)   SpO2 98%   BMI 27.41 kg/m  Wt Readings from Last 3 Encounters:  07/23/20 175 lb (79.4 kg)  06/03/20 169 lb 6.4 oz (76.8 kg)  05/28/20 168 lb (76.2 kg)    Physical Exam Vitals and nursing note reviewed.  Constitutional:      Appearance: She is well-developed.  HENT:     Head: Normocephalic and atraumatic.  Cardiovascular:     Rate and Rhythm: Normal rate and regular rhythm.     Heart sounds: Normal heart sounds. No murmur heard.  No friction rub. No gallop.   Pulmonary:     Effort: Pulmonary effort is normal. No tachypnea or respiratory distress.     Breath sounds: Normal breath sounds. No decreased breath sounds, wheezing, rhonchi or rales.  Chest:     Chest wall: No tenderness.  Abdominal:     General: Bowel sounds are normal.     Palpations: Abdomen is soft.  Musculoskeletal:        General: No  swelling or deformity. Normal range of motion.     Cervical back: Normal range of motion.     Right knee: No swelling, deformity or erythema. Normal range of motion. Tenderness present over the medial joint line and lateral joint line.  Skin:    General: Skin is warm and dry.  Neurological:     Mental Status: She is alert and oriented to person, place, and time.     Coordination: Coordination normal.  Psychiatric:        Behavior: Behavior normal. Behavior is cooperative.        Thought Content: Thought content normal.        Judgment: Judgment normal.          Patient has been counseled extensively about nutrition and exercise as well as the importance of adherence with medications and regular follow-up. The patient was given clear instructions to go to ER or return to medical center if symptoms don't improve, worsen or new problems develop. The patient verbalized understanding.   Follow-up: Return in about 3 months (around 10/22/2020).   Gildardo Pounds, FNP-BC [facility] and [place], [place]   07/23/2020, 1:31 PM

## 2020-07-24 ENCOUNTER — Telehealth: Payer: Self-pay

## 2020-07-24 LAB — HEMOGLOBIN A1C
Est. average glucose Bld gHb Est-mCnc: 114 mg/dL
Hgb A1c MFr Bld: 5.6 % (ref 4.8–5.6)

## 2020-07-24 LAB — LIPID PANEL
Chol/HDL Ratio: 2.7 ratio (ref 0.0–4.4)
Cholesterol, Total: 170 mg/dL (ref 100–199)
HDL: 62 mg/dL (ref >39)
LDL Chol Calc (NIH): 85 mg/dL (ref 0–99)
Triglycerides: 131 mg/dL (ref 0–149)
VLDL Cholesterol Cal: 23 mg/dL (ref 5–40)

## 2020-07-24 LAB — HEPATITIS C ANTIBODY: Hep C Virus Ab: 1.9 s/co ratio — ABNORMAL HIGH (ref 0.0–0.9)

## 2020-07-24 MED ORDER — LANTUS SOLOSTAR 100 UNIT/ML ~~LOC~~ SOPN: 18.0000 [IU] | PEN_INJECTOR | Freq: Every day | SUBCUTANEOUS | 2 refills | Status: DC

## 2020-07-24 NOTE — Telephone Encounter (Signed)
Rx sent for Lantus.  ?

## 2020-07-24 NOTE — Telephone Encounter (Signed)
Lantus Solostar is preferred on pt's ins.  Can you please resend her Basaglar script as Lantus to the [facility]?

## 2020-07-24 NOTE — Addendum Note (Signed)
Addended by: Daisy Blossom, Annie Main L on: 07/24/2020 05:15 PM   Modules accepted: Orders

## 2020-07-26 ENCOUNTER — Other Ambulatory Visit: Payer: Self-pay | Admitting: Nurse Practitioner

## 2020-07-28 ENCOUNTER — Ambulatory Visit: Payer: Self-pay

## 2020-07-28 ENCOUNTER — Ambulatory Visit (INDEPENDENT_AMBULATORY_CARE_PROVIDER_SITE_OTHER): Payer: 59 | Admitting: Orthopaedic Surgery

## 2020-07-28 ENCOUNTER — Encounter: Payer: Self-pay | Admitting: Physician Assistant

## 2020-07-28 DIAGNOSIS — M79604 Pain in right leg: Secondary | ICD-10-CM | POA: Diagnosis not present

## 2020-07-28 MED ORDER — TRAMADOL HCL 50 MG PO TABS: 50.0000 mg | ORAL_TABLET | Freq: Two times a day (BID) | ORAL | 1 refills | Status: DC

## 2020-07-28 MED ORDER — PREDNISONE 10 MG (21) PO TBPK: ORAL_TABLET | ORAL | 0 refills | Status: DC

## 2020-07-28 NOTE — Progress Notes (Signed)
Office Visit Note   Patient: Roberta Jenkins           Date of Birth: 03-Jun-1961           MRN: [id number] Visit Date: 07/28/2020              Requested by: Gildardo Pounds, NP [address],  [place] [id number] PCP: Gildardo Pounds, NP   Assessment & Plan: Visit Diagnoses:  1. Pain in right leg     Plan: Impression is chronic right knee pain with underlying osteoarthritis and degenerative lateral meniscus tear as well as questionable right lower extremity radiculopathy.  It is hard to tell what is causing majority the patient's symptoms, although I think most is coming from her knee.  We have discussed repeating cortisone injection but she would like to hold off for now.  We also discussed a steroid taper as well as a course of physical therapy to help with quadricep strengthening which she would like to try.  She will follow up with [place] as needed.  Follow-Up Instructions: Return if symptoms worsen or fail to improve.   Orders:  Orders Placed This Encounter  Procedures  . XR Lumbar Spine 2-3 Views  . Ambulatory referral to Physical Therapy   Meds ordered this encounter  Medications  . predniSONE (STERAPRED UNI-PAK 21 TAB) 10 MG (21) TBPK tablet    Sig: Take as directed    Dispense:  21 tablet    Refill:  0  . traMADol (ULTRAM) 50 MG tablet    Sig: Take 1 tablet (50 mg total) by mouth 2 (two) times daily.    Dispense:  30 tablet    Refill:  1      Procedures: No procedures performed   Clinical Data: No additional findings.   Subjective: Chief Complaint  Patient presents with  . Right Knee - Pain    HPI patient is a pleasant 59 year old female who comes in today with recurrent right knee pain and right lower extremity radiculopathy.  She has been dealing with right knee pain for a while now.  She has had cortisone as well as viscosupplementation injections in the past.  Cortisone injections provided temporary relief.  Viscosupplementation injection  helped for about 2 to 3 months, but notes increased pain to the right hip following the injection.  The pain she has is primarily to the anterior aspect of the right knee but also notes pain to the right buttocks, lateral hip and down the back leg into the ankle.  No numbness, tingling or burning.  Pain is worse going up and down stairs as well as with flexion of the knee.  She has recently tried meloxicam with minimal relief.  Review of Systems as detailed in HPI.  All others reviewed and are negative.   Objective: Vital Signs: LMP 08/09/2018 (Approximate)   Physical Exam well-developed well-nourished female no acute distress.  Alert oriented x3.  Ortho Exam examination of the right knee shows range of motion from 0 to 115 degrees.  Medial and lateral joint line tenderness.  Moderate patellofemoral crepitus.  Lumbar spine shows no spinous or paraspinous tenderness.  No pain with range of motion.  Minimally positive straight leg raise.  She is neurovascularly intact distally.  No focal weakness.  Specialty Comments:  No specialty comments available.  Imaging: XR Lumbar Spine 2-3 Views  Result Date: 07/28/2020 No acute or structural abnormalities    PMFS History: Patient Active Problem List  Diagnosis Date Noted  . Type 2 diabetes mellitus with hyperglycemia ([place]) 12/19/2019  . Fibroid 09/04/2015  . Menorrhagia 12/17/2011   Past Medical History:  Diagnosis Date  . Back pain   . Diabetes mellitus ([place])   . Glaucoma   . Hyperlipidemia   . Menorrhagia 12/17/2011  . No pertinent past medical history     Family History  Problem Relation Age of Onset  . Cancer Mother        colon  . Cancer Father   . Kidney disease Sister   . Hypertension Brother     Past Surgical History:  Procedure Laterality Date  . [place]   2x  . MULTIPLE TOOTH EXTRACTIONS     Social History   Occupational History  . Not on file  Tobacco Use  . Smoking status: Never Smoker   . Smokeless tobacco: Never Used  Vaping Use  . Vaping Use: Never used  Substance and Sexual Activity  . Alcohol use: No  . Drug use: No  . Sexual activity: Never    Birth control/protection: Abstinence    Comment: last intercourse was 5 yrs ago

## 2020-07-30 ENCOUNTER — Other Ambulatory Visit: Payer: Self-pay

## 2020-07-30 ENCOUNTER — Encounter: Payer: Self-pay | Admitting: Physical Therapy

## 2020-07-30 ENCOUNTER — Ambulatory Visit: Payer: 59 | Attending: Physician Assistant | Admitting: Physical Therapy

## 2020-07-30 DIAGNOSIS — M545 Low back pain, unspecified: Secondary | ICD-10-CM

## 2020-07-30 DIAGNOSIS — R2689 Other abnormalities of gait and mobility: Secondary | ICD-10-CM | POA: Insufficient documentation

## 2020-07-30 DIAGNOSIS — R252 Cramp and spasm: Secondary | ICD-10-CM | POA: Diagnosis present

## 2020-07-30 DIAGNOSIS — M25561 Pain in right knee: Secondary | ICD-10-CM | POA: Diagnosis not present

## 2020-07-30 DIAGNOSIS — G8929 Other chronic pain: Secondary | ICD-10-CM | POA: Insufficient documentation

## 2020-07-30 NOTE — Patient Instructions (Signed)
Access Code: [email or web address] URL: [email or web address] Date: 07/30/2020 Prepared by: Carolyne Littles  Exercises .Supine Heel Slide with Strap - 3 x daily - 7 x weekly - 3 sets - 5 reps - 5 sec hold .Standing Glute Med Mobilization with Small Ball on Wall - 3 x daily - 7 x weekly - 2 min hold .Supine Quad Set - 1 x daily - 7 x weekly - 2 sets - 10 reps - 5 sec hold

## 2020-07-30 NOTE — Therapy (Signed)
[place], [place], [address] Phone: [phone]   Fax:  [phone]  Physical Therapy Evaluation  Patient Details  Name: Roberta Jenkins MRN: [id number] Date of Birth: [birth year] Referring Provider (PT): Tiney Rouge PA-C    Encounter Date: 07/30/2020   PT End of Session - 07/30/20 0814    Visit Number 1    Number of Visits 12    Date for PT Re-Evaluation 09/10/20    Authorization Type [employer]% co-insurance    PT Start Time 0800    PT Stop Time 0845    PT Time Calculation (min) 45 min    Activity Tolerance Patient tolerated treatment well    Behavior During Therapy [facility] for tasks assessed/performed           Past Medical History:  Diagnosis Date  . Back pain   . Diabetes mellitus ([place])   . Glaucoma   . Hyperlipidemia   . Menorrhagia 12/17/2011  . No pertinent past medical history     Past Surgical History:  Procedure Laterality Date  . [place]   2x  . MULTIPLE TOOTH EXTRACTIONS      There were no vitals filed for this visit.    Subjective Assessment - 07/30/20 0805    Subjective In February the patient began having right knee pain. She had injections and antiinflamatories but nothing has worked  Her MRI shows degeneration of her lateral meniscus. She had no fall.or accident. She had an incidious onset of pain.    Pertinent History back pain, DMII,    How long can you stand comfortably? < 10 minutes without pain    How long can you walk comfortably? limited ability to ambualte distances without pain    Diagnostic tests right knee MRI: lateral mendsical tear    Currently in Pain? Yes    Pain Score 7     Pain Location Knee    Pain Orientation Right    Pain Descriptors / Indicators Aching    Pain Type Chronic pain    Pain Onset More than a month ago    Pain Frequency Constant    Aggravating Factors  stairs, bending, kneeling, night time    Pain  Relieving Factors Nothing; pills help a little sometimes    Effect of Pain on Daily Activities difficulty perfroming ADL's    Multiple Pain Sites Yes    Pain Score 7    Pain Location Back    Pain Orientation Right;Left    Pain Descriptors / Indicators Aching    Pain Type Chronic pain    Pain Onset More than a month ago    Pain Frequency Constant    Aggravating Factors  getting in and out of the bath tub, standing and walking    Pain Relieving Factors Nothing              [facility] PT Assessment - 07/30/20 0001      Assessment   Medical Diagnosis Right Knee and Right sided Low Back Pain     Referring Provider (PT) Tiney Rouge PA-C     Onset Date/Surgical Date --   February 2021    Hand Dominance Right    Next MD Visit None at this time     Prior Therapy None       Precautions   Precautions None      Restrictions   Weight Bearing Restrictions No      Balance Screen  Has the patient fallen in the past 6 months No    Has the patient had a decrease in activity level because of a fear of falling?  No    Is the patient reluctant to leave their home because of a fear of falling?  No      Home Environment   Living Environment Private residence    Additional Comments 20 steps into her apartment,.      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   Overall Cognitive Status Within Functional Limits for tasks assessed    Attention Focused    Focused Attention Appears intact    Memory Appears intact    Awareness Appears intact      Observation/Other Assessments   Focus on Therapeutic Outcomes (FOTO)  64% limited 43% expected       Sensation   Light Touch Appears Intact      Coordination   Gross Motor Movements are Fluid and Coordinated Yes    Fine Motor Movements are Fluid and Coordinated Yes      ROM / Strength   AROM / PROM / Strength AROM;PROM;Strength      AROM    Overall AROM Comments hip flexion painful at 55 degrees     AROM Assessment Site Knee;Lumbar    Right/Left Knee Right    Right Knee Flexion 55   with pain    Lumbar Flexion 60 degrees without pain     Lumbar Extension full pain at end range but went into extreme end range despite cuing    Lumbar - Right Side Bend full no pain     Lumbar - Left Side Bend full no pain     Lumbar - Right Rotation full no pain     Lumbar - Left Rotation full no pain       PROM   Overall PROM Comments able to push hip to 62 degrees with pain     PROM Assessment Site Knee    Right/Left Knee Right      Strength   Strength Assessment Site Knee;Hip    Right/Left Hip Right;Left    Right Hip Flexion 3/5    Right Hip ABduction 4/5    Right Hip ADduction 4+/5    Left Hip Flexion 5/5    Left Hip ABduction 5/5    Left Hip ADduction 5/5    Right/Left Knee Right;Left    Right Knee Flexion 4+/5    Right Knee Extension 4+/5    Left Knee Flexion 5/5    Left Knee Extension 5/5      Palpation   Palpation comment tednerness to palpation in the lateral knee and IT band; spasming in the upper roight lguteal and into the lower lumbar paraspinals       Special Tests   Other special tests MRI already performed      Ambulation/Gait   Gait Comments decreased weight bearing on the right LE; decreased right single leg stance                       Objective measurements completed on examination: See above findings.       [facility] Adult PT Treatment/Exercise - 07/30/20 0001      Exercises   Exercises Knee/Hip;Lumbar      Knee/Hip Exercises: Stretches   Other Knee/Hip Stretches  gentle heel slide with towel for knee and hip flexion. Significant education provided on the improtance of not driving through painful ranges       Knee/Hip Exercises: Standing   Other Standing Knee Exercises trigger point release to gluteal and lumbar spine       Knee/Hip Exercises: Supine   Quad Sets Limitations 2x10 5 sec  hold                   PT Education - 07/30/20 0819    Education Details improving knee and hip flexion; improtance of strengthening the knee and core; HEP; symptom mangement; ice    Person(s) Educated Patient    Methods Explanation;Demonstration;Tactile cues;Verbal cues    Comprehension Returned demonstration;Verbal cues required;Tactile cues required;Verbalized understanding            PT Short Term Goals - 07/30/20 1616      PT SHORT TERM GOAL #1   Title review FOTO with patient    Time 3    Period Weeks    Status New    Target Date 08/20/20      PT SHORT TERM GOAL #2   Title Patient will increase right knee flexion by 25 degrees    Time 3    Period Weeks    Status New    Target Date 08/20/20      PT SHORT TERM GOAL #3   Title Patient will increase right hip passive flexion by 20 degrees    Time 3    Period Weeks    Status New    Target Date 08/20/20      PT SHORT TERM GOAL #4   Title Patient will increase gross right LE strength to 4/5    Time 3    Period Weeks    Status New    Target Date 08/20/20             PT Long Term Goals - 07/30/20 1619      PT LONG TERM GOAL #1   Title Patient will return to exercises without pain    Time 6    Period Weeks    Status New    Target Date 09/10/20      PT LONG TERM GOAL #2   Title Patient will stand for 30 minutes without pain    Time 6    Period Weeks    Status New    Target Date 09/10/20      PT LONG TERM GOAL #3   Title Patient will ambualte 3000' without increased pain in order to perfrom ADL's    Time 6    Period Weeks    Status New    Target Date 09/10/20                  Plan - 07/30/20 0814    Clinical Impression Statement Patient is a 59 year old female with an incidious onset of right knee pain in February. She also over the past few months has developed significant lower back pain as well. She has significant limitations in right knee and hip flexion. She has spasming in  her lumbar spine and right hip. She has an antalgic gait with decreased single leg stance on the right and lateral movement at the hips. The patient would benefit from skilled therapy to improve h and kenee motion, improve gait, and improve her ability to begin exercissing agian. She has 20 steps into her house. She would benefit from improved  ability to get into her house.    Personal Factors and Comorbidities Comorbidity 1    Comorbidities Knee and Low back arthritis    Examination-Activity Limitations Bend;Stairs;Squat;Stand;Lift;Locomotion Level    Examination-Participation Restrictions Cleaning;Community Activity;Shop    Stability/Clinical Decision Making Evolving/Moderate complexity   increased swelling and pain in her right knee despite intervention   Clinical Decision Making Moderate    Rehab Potential Good    PT Frequency 2x / week    PT Duration 6 weeks    PT Treatment/Interventions Electrical Stimulation;ADLs/Self Care Home Management;Iontophoresis 4mg /ml Dexamethasone;Moist Heat;Ultrasound;Cryotherapy;DME Instruction;Therapeutic activities;Patient/family education;Manual techniques;Passive range of motion;Dry needling;Taping    PT Next Visit Plan manual therapy to her hip to improve flexion; consider LAD if her knee can tolerate it. It will be a little complicated 2nd to the amount of pain and limited motion in her knee and hip. Caosider gentle knee mbilization to improve motion; light exercises. may have to do standing weight shifts and light hip exercises    PT Home Exercise Plan trigger point release to lumbar spine; heel slide with towel with signifcant education on pain free range    Consulted and Agree with Plan of Care Patient           Patient will benefit from skilled therapeutic intervention in order to improve the following deficits and impairments:  Abnormal gait, Difficulty walking, Pain, Decreased activity tolerance, Decreased endurance, Decreased range of motion,  Decreased strength, Decreased mobility  Visit Diagnosis: Chronic pain of right knee  Chronic bilateral low back pain without sciatica  Other abnormalities of gait and mobility  Cramp and spasm     Problem List Patient Active Problem List   Diagnosis Date Noted  . Type 2 diabetes mellitus with hyperglycemia ([place]) 12/19/2019  . Fibroid 09/04/2015  . Menorrhagia 12/17/2011    Carney Living PT DPT  07/30/2020, 4:22 PM  [facility] [address] [place], [place], [address] Phone: [phone]   Fax:  [phone]  Name: Fritzie Prioleau MRN: [id number] Date of Birth: 03-16-1961

## 2020-08-06 ENCOUNTER — Other Ambulatory Visit: Payer: Self-pay

## 2020-08-06 ENCOUNTER — Ambulatory Visit: Payer: 59 | Attending: Physician Assistant | Admitting: Physical Therapy

## 2020-08-06 ENCOUNTER — Encounter: Payer: Self-pay | Admitting: Physical Therapy

## 2020-08-06 DIAGNOSIS — G8929 Other chronic pain: Secondary | ICD-10-CM | POA: Insufficient documentation

## 2020-08-06 DIAGNOSIS — R252 Cramp and spasm: Secondary | ICD-10-CM | POA: Diagnosis present

## 2020-08-06 DIAGNOSIS — R2689 Other abnormalities of gait and mobility: Secondary | ICD-10-CM | POA: Insufficient documentation

## 2020-08-06 DIAGNOSIS — M545 Low back pain, unspecified: Secondary | ICD-10-CM | POA: Diagnosis present

## 2020-08-06 DIAGNOSIS — M25561 Pain in right knee: Secondary | ICD-10-CM | POA: Insufficient documentation

## 2020-08-06 NOTE — Therapy (Signed)
[place], [place], [address] Phone: [phone]   Fax:  [phone]  Physical Therapy Treatment  Patient Details  Name: Roberta Jenkins MRN: [id number] Date of Birth: 01/29/1961 Referring Provider (PT): Tiney Rouge PA-C    Encounter Date: 08/06/2020   PT End of Session - 08/06/20 0845    Visit Number 2    Number of Visits 12    Date for PT Re-Evaluation 09/10/20    Authorization Type [employer]% co-insurance    PT Start Time 0800    PT Stop Time 0848    PT Time Calculation (min) 48 min           Past Medical History:  Diagnosis Date  . Back pain   . Diabetes mellitus ([place])   . Glaucoma   . Hyperlipidemia   . Menorrhagia 12/17/2011  . No pertinent past medical history     Past Surgical History:  Procedure Laterality Date  . [place]   2x  . MULTIPLE TOOTH EXTRACTIONS      There were no vitals filed for this visit.   Subjective Assessment - 08/06/20 0805    Subjective right knee is the worst today. Across lower back is hurting some.    Currently in Pain? Yes    Pain Score 7     Pain Location Knee    Pain Score 4    Pain Location Back              [facility] PT Assessment - 08/06/20 0001      AROM   Right Knee Flexion 85      PROM   Right Knee Flexion 90                         [facility] Adult PT Treatment/Exercise - 08/06/20 0001      Knee/Hip Exercises: Seated   Other Seated Knee/Hip Exercises Seated heel slides with towel, seated QS , seated DF/PF     Marching 10 reps      Knee/Hip Exercises: Supine   Quad Sets 10 reps    Short Arc Quad Sets 10 reps    Heel Slides 10 reps   after manual    Heel Slides Limitations using strap for assist     Straight Leg Raises 10 reps      Modalities   Modalities Moist Heat      Moist Heat Therapy   Number Minutes Moist Heat 10 Minutes    Moist Heat Location Knee   right quad/knee      Manual Therapy   Manual Therapy Soft tissue mobilization;Passive ROM    Soft tissue mobilization Manual soft tissue work to right quad with education on self massage prior to stretching.     Passive ROM Gentle knee to chest after soft tissue work, able to achieve 90 degrees hip and knee flexion                    PT Short Term Goals - 07/30/20 1616      PT SHORT TERM GOAL #1   Title review FOTO with patient    Time 3    Period Weeks    Status New    Target Date 08/20/20      PT SHORT TERM GOAL #2   Title Patient will increase right knee flexion by 25 degrees    Time 3  Period Weeks    Status New    Target Date 08/20/20      PT SHORT TERM GOAL #3   Title Patient will increase right hip passive flexion by 20 degrees    Time 3    Period Weeks    Status New    Target Date 08/20/20      PT SHORT TERM GOAL #4   Title Patient will increase gross right LE strength to 4/5    Time 3    Period Weeks    Status New    Target Date 08/20/20             PT Long Term Goals - 07/30/20 1619      PT LONG TERM GOAL #1   Title Patient will return to exercises without pain    Time 6    Period Weeks    Status New    Target Date 09/10/20      PT LONG TERM GOAL #2   Title Patient will stand for 30 minutes without pain    Time 6    Period Weeks    Status New    Target Date 09/10/20      PT LONG TERM GOAL #3   Title Patient will ambualte 3000' without increased pain in order to perfrom ADL's    Time 6    Period Weeks    Status New    Target Date 09/10/20                 Plan - 08/06/20 0848    Clinical Impression Statement Pt arrives with stiffness in right knee. She reports lumbar pain decreases with using Tennis ball for self massage. Treated right knee with STW to right quad to decrease tension for ROM. She was able to achieve 90 passive knee and hip flexion after manual work. HMP at end of session for further ease of tension.    PT Next Visit Plan  manual therapy to her hip to improve flexion; consider LAD if her knee can tolerate it. It will be a little complicated 2nd to the amount of pain and limited motion in her knee and hip. Caosider gentle knee mbilization to improve motion; light exercises. may have to do standing weight shifts and light hip exercises    PT Home Exercise Plan trigger point release to lumbar spine; heel slide with towel with signifcant education on pain free range           Patient will benefit from skilled therapeutic intervention in order to improve the following deficits and impairments:  Abnormal gait, Difficulty walking, Pain, Decreased activity tolerance, Decreased endurance, Decreased range of motion, Decreased strength, Decreased mobility  Visit Diagnosis: Chronic pain of right knee  Chronic bilateral low back pain without sciatica  Other abnormalities of gait and mobility  Cramp and spasm     Problem List Patient Active Problem List   Diagnosis Date Noted  . Type 2 diabetes mellitus with hyperglycemia ([place]) 12/19/2019  . Fibroid 09/04/2015  . Menorrhagia 12/17/2011    Dorene Ar, PTA 08/06/2020, 8:50 AM  [facility] [address] [place], [place], [address] Phone: [phone]   Fax:  [phone]  Name: Roberta Jenkins MRN: [id number] Date of Birth: 04-20-1961

## 2020-08-13 ENCOUNTER — Other Ambulatory Visit: Payer: Self-pay

## 2020-08-13 ENCOUNTER — Ambulatory Visit: Payer: 59 | Admitting: Physical Therapy

## 2020-08-13 DIAGNOSIS — R2689 Other abnormalities of gait and mobility: Secondary | ICD-10-CM

## 2020-08-13 DIAGNOSIS — R252 Cramp and spasm: Secondary | ICD-10-CM

## 2020-08-13 DIAGNOSIS — G8929 Other chronic pain: Secondary | ICD-10-CM

## 2020-08-13 DIAGNOSIS — M545 Low back pain, unspecified: Secondary | ICD-10-CM

## 2020-08-13 DIAGNOSIS — M25561 Pain in right knee: Secondary | ICD-10-CM | POA: Diagnosis not present

## 2020-08-13 NOTE — Therapy (Signed)
[place], [place], [address] Phone: [phone]   Fax:  [phone]  Physical Therapy Treatment  Patient Details  Name: Roberta Jenkins MRN: [id number] Date of Birth: [birth year] Referring Provider (PT): Tiney Rouge PA-C    Encounter Date: 08/13/2020   PT End of Session - 08/13/20 0854    Visit Number 3    Number of Visits 12    Date for PT Re-Evaluation 09/10/20    Authorization Type [employer]30% co-insurance    PT Start Time 0802    PT Stop Time 0850    PT Time Calculation (min) 48 min           Past Medical History:  Diagnosis Date  . Back pain   . Diabetes mellitus ([place])   . Glaucoma   . Hyperlipidemia   . Menorrhagia 12/17/2011  . No pertinent past medical history     Past Surgical History:  Procedure Laterality Date  . [place]   2x  . MULTIPLE TOOTH EXTRACTIONS      There were no vitals filed for this visit.   Subjective Assessment - 08/13/20 0852    Subjective I am doing okay today. A little pain in right knee. Back is doing okay.    Currently in Pain? Yes    Pain Score 4     Pain Location Knee    Pain Orientation Right;Medial    Pain Descriptors / Indicators Aching    Pain Type Chronic pain    Aggravating Factors  stairs, bending    Pain Relieving Factors exercises, better diet    Pain Score 0    Pain Location Back              [facility] PT Assessment - 08/13/20 0001      AROM   AROM Assessment Site Hip    Right/Left Hip Right    Right Hip Flexion 90    Right Knee Flexion 92      PROM   Overall PROM Comments AROM right hip 90                         [facility] Adult PT Treatment/Exercise - 08/13/20 0001      Knee/Hip Exercises: Aerobic   Nustep L2 UE/LE small ROM       Knee/Hip Exercises: Seated   Long Arc Quad Right;Left;2 sets;10 reps    Long Arc Quad Limitations red band     Other Seated Knee/Hip Exercises Seated  heel slides with towel, seated QS , seated DF/PF     Marching 10 reps    Hamstring Curl 2 sets;10 reps    Hamstring Limitations red band around ankles       Knee/Hip Exercises: Supine   Heel Slides 10 reps    Straight Leg Raises 10 reps    Straight Leg Raises Limitations with cues for initial quad set       Moist Heat Therapy   Number Minutes Moist Heat 10 Minutes    Moist Heat Location Knee      Manual Therapy   Passive ROM Passive hip flexor stretch , off side of mat                   PT Education - 08/13/20 0903    Education Details FOTO report and predictions reviewed with patient (KNEE) and HEP    Person(s) Educated Patient  Methods Explanation;Handout    Comprehension Verbalized understanding            PT Short Term Goals - 08/13/20 0908      PT SHORT TERM GOAL #1   Title review FOTO with patient    Baseline reviewed on visit #3    Time 3    Period Weeks    Status Achieved    Target Date 08/20/20      PT SHORT TERM GOAL #2   Title Patient will increase right knee flexion by 25 degrees    Baseline from 55 to 92 08/13/20    Time 3    Period Weeks    Status Achieved    Target Date 08/20/20      PT SHORT TERM GOAL #3   Title Patient will increase right hip passive flexion by 20 degrees (62at eval)    Baseline AROM improved to 90    Time 3    Period Weeks    Status Achieved      PT SHORT TERM GOAL #4   Title Patient will increase gross right LE strength to 4/5    Time 3    Period Weeks    Status Unable to assess             PT Long Term Goals - 07/30/20 1619      PT LONG TERM GOAL #1   Title Patient will return to exercises without pain    Time 6    Period Weeks    Status New    Target Date 09/10/20      PT LONG TERM GOAL #2   Title Patient will stand for 30 minutes without pain    Time 6    Period Weeks    Status New    Target Date 09/10/20      PT LONG TERM GOAL #3   Title Patient will ambualte 3000' without increased  pain in order to perfrom ADL's    Time 6    Period Weeks    Status New    Target Date 09/10/20                 Plan - 08/13/20 1020    Clinical Impression Statement Pt reports improved back pain and less knee pain. Her AROM has significantly improved in knee and hip flexion. Able to add open chain knee strength for HEP. Continued PROM and HMP at end of session. She has met LTG#1,#2,#3.    PT Next Visit Plan manual therapy to her hip to improve flexion; consider LAD if her knee can tolerate it. It will be a little complicated 2nd to the amount of pain and limited motion in her knee and hip. Caosider gentle knee mbilization to improve motion; light exercises. may have to do standing weight shifts and light hip exercises    PT Home Exercise Plan trigger point release to lumbar spine; heel slide with towel with signifcant education on pain free range           Patient will benefit from skilled therapeutic intervention in order to improve the following deficits and impairments:  Abnormal gait, Difficulty walking, Pain, Decreased activity tolerance, Decreased endurance, Decreased range of motion, Decreased strength, Decreased mobility  Visit Diagnosis: Chronic pain of right knee  Chronic bilateral low back pain without sciatica  Other abnormalities of gait and mobility  Cramp and spasm     Problem List Patient Active Problem List   Diagnosis Date Noted  .  Type 2 diabetes mellitus with hyperglycemia ([place]) 12/19/2019  . Fibroid 09/04/2015  . Menorrhagia 12/17/2011    Dorene Ar, PTA 08/13/2020, 10:24 AM  [facility] [address] [place], [place], [address] Phone: [phone]   Fax:  [phone]  Name: Jo-Anne Kluth MRN: [id number] Date of Birth: 1961/10/21

## 2020-08-13 NOTE — Patient Instructions (Signed)
Knee Extension: Resisted (Sitting)   With band looped around right ankle and under other foot, straighten leg with ankle loop. Keep other leg bent to increase resistance. Repeat __10__ times per set. Do __2__ sets per session. Do _2___ sessions per day.  http://orth.exer.us/690   CKnee Flexion: Resisted (Sitting)   Sit with band under left foot and looped around ankle of supported leg. Pull unsupported leg back. Repeat ___10_ times per set. Do __2__ sets per session. Do __2__ sessions per day.  http://orth.exer.us/695   Copyright  VHI. All rights reserved.

## 2020-08-14 ENCOUNTER — Telehealth: Payer: Self-pay

## 2020-08-14 NOTE — Telephone Encounter (Signed)
Pt. Called back. Given results and instructions. Please place order for additional Hep C testing and advise pt. When this is done.

## 2020-08-15 NOTE — Telephone Encounter (Signed)
Attempt to reach patient to schedule a lab appt for additional lab for HEP C. If patient call back, please schedule a lab appt. No answer and unable to LVM.

## 2020-08-19 ENCOUNTER — Encounter: Payer: Self-pay | Admitting: Physical Therapy

## 2020-08-19 ENCOUNTER — Other Ambulatory Visit: Payer: Self-pay

## 2020-08-19 ENCOUNTER — Ambulatory Visit: Payer: 59 | Admitting: Physical Therapy

## 2020-08-19 DIAGNOSIS — M25561 Pain in right knee: Secondary | ICD-10-CM | POA: Diagnosis not present

## 2020-08-19 DIAGNOSIS — M545 Low back pain, unspecified: Secondary | ICD-10-CM

## 2020-08-19 DIAGNOSIS — R2689 Other abnormalities of gait and mobility: Secondary | ICD-10-CM

## 2020-08-19 DIAGNOSIS — R252 Cramp and spasm: Secondary | ICD-10-CM

## 2020-08-19 DIAGNOSIS — G8929 Other chronic pain: Secondary | ICD-10-CM

## 2020-08-19 NOTE — Therapy (Signed)
[place], [place], [address] Phone: [phone]   Fax:  [phone]  Physical Therapy Treatment  Patient Details  Name: Roberta Jenkins MRN: [id number] Date of Birth: 29-Jul-1961 Referring Provider (PT): Tiney Rouge PA-C    Encounter Date: 08/19/2020   PT End of Session - 08/19/20 0807    Visit Number 4    Number of Visits 12    Date for PT Re-Evaluation 09/10/20    Authorization Type [employer]% co-insurance    PT Start Time 0804    PT Stop Time 6962    PT Time Calculation (min) 51 min           Past Medical History:  Diagnosis Date  . Back pain   . Diabetes mellitus ([place])   . Glaucoma   . Hyperlipidemia   . Menorrhagia 12/17/2011  . No pertinent past medical history     Past Surgical History:  Procedure Laterality Date  . [place]   2x  . MULTIPLE TOOTH EXTRACTIONS      There were no vitals filed for this visit.   Subjective Assessment - 08/19/20 0806    Subjective Knee has tightness, back has a little pain. The knee is getting better.    Currently in Pain? Yes    Pain Score 0-No pain    Pain Location Knee    Pain Score 7    Pain Location Back    Pain Orientation Lower    Pain Descriptors / Indicators Aching    Pain Type Chronic pain    Aggravating Factors  standing and walking    Pain Relieving Factors heat ice              [facility] PT Assessment - 08/19/20 0001      AROM   Right Knee Flexion 92   98 after PROM, manual     PROM   Overall PROM Comments AROM right hip 90                         [facility] Adult PT Treatment/Exercise - 08/19/20 0001      Knee/Hip Exercises: Aerobic   Nustep L2 UE/LE small ROM       Knee/Hip Exercises: Standing   Other Standing Knee Exercises squats at free motion, slow marching for weight shifting, 3 way hip x 10 bilateral, tandem stance 30sec      Knee/Hip Exercises: Seated   Long Arc Quad  Right;Left;2 sets;10 reps    Long Arc Quad Limitations red band     Other Seated Knee/Hip Exercises seated heel slide with towel x 20     Marching 20 reps    Hamstring Curl 2 sets;10 reps    Hamstring Limitations red band around ankles       Knee/Hip Exercises: Supine   Quad Sets 10 reps    Short Arc Quad Sets 10 reps    Heel Slides 20 reps      Moist Heat Therapy   Number Minutes Moist Heat 10 Minutes    Moist Heat Location Knee      Manual Therapy   Soft tissue mobilization STW right quad     Passive ROM patella mobs, PROM knee and hip flexion                    PT Short Term Goals - 08/13/20 0908      PT SHORT  TERM GOAL #1   Title review FOTO with patient    Baseline reviewed on visit #3    Time 3    Period Weeks    Status Achieved    Target Date 08/20/20      PT SHORT TERM GOAL #2   Title Patient Roberta increase right knee flexion by 25 degrees    Baseline from 55 to 92 08/13/20    Time 3    Period Weeks    Status Achieved    Target Date 08/20/20      PT SHORT TERM GOAL #3   Title Patient Roberta increase right hip passive flexion by 20 degrees (62at eval)    Baseline AROM improved to 90    Time 3    Period Weeks    Status Achieved      PT SHORT TERM GOAL #4   Title Patient Roberta increase gross right LE strength to 4/5    Time 3    Period Weeks    Status Unable to assess             PT Long Term Goals - 07/30/20 1619      PT LONG TERM GOAL #1   Title Patient Roberta return to exercises without pain    Time 6    Period Weeks    Status New    Target Date 09/10/20      PT LONG TERM GOAL #2   Title Patient Roberta stand for 30 minutes without pain    Time 6    Period Weeks    Status New    Target Date 09/10/20      PT LONG TERM GOAL #3   Title Patient Roberta ambualte 3000' without increased pain in order to perfrom ADL's    Time 6    Period Weeks    Status New    Target Date 09/10/20                 Plan - 08/19/20 0808     Clinical Impression Statement Pt reports difficulty getting up from commode, feeling like she might fall. Her knee flexion AROM is unchanged since last session however improved from 92 to 98 by end of session. Worked on using bilat LE to rise from chair. Began standing squats and reviwed theraband knee HEP. Increased pain at proximal knee with knee and hip PROM. Manual used to improve flexion rom, STW to right distal quad and HMP to quad at end of session.    Comorbidities Knee and Low back arthritis    PT Treatment/Interventions Electrical Stimulation;ADLs/Self Care Home Management;Iontophoresis 4mg /ml Dexamethasone;Moist Heat;Ultrasound;Cryotherapy;DME Instruction;Therapeutic activities;Patient/family education;Manual techniques;Passive range of motion;Dry needling;Taping    PT Next Visit Plan manual therapy to her hip to improve flexion; consider LAD if her knee can tolerate it. It Roberta be a little complicated 2nd to the amount of pain and limited motion in her knee and hip. Caosider gentle knee mbilization to improve motion; light exercises. may have to do standing weight shifts and light hip exercises    PT Home Exercise Plan trigger point release to lumbar spine; heel slide with towel with signifcant education on pain free range           Patient Roberta benefit from skilled therapeutic intervention in order to improve the following deficits and impairments:  Abnormal gait, Difficulty walking, Pain, Decreased activity tolerance, Decreased endurance, Decreased range of motion, Decreased strength, Decreased mobility  Visit Diagnosis: Chronic pain of right knee  Chronic bilateral low back pain without sciatica  Other abnormalities of gait and mobility  Cramp and spasm     Problem List Patient Active Problem List   Diagnosis Date Noted  . Type 2 diabetes mellitus with hyperglycemia ([place]) 12/19/2019  . Fibroid 09/04/2015  . Menorrhagia 12/17/2011    Dorene Ar,  PTA 08/19/2020, 10:39 AM  [facility] [address] [place], [place], [address] Phone: [phone]   Fax:  [phone]  Name: Roberta Jenkins MRN: [id number] Date of Birth: 24-Nov-1960

## 2020-08-21 ENCOUNTER — Ambulatory Visit: Payer: 59 | Admitting: Physical Therapy

## 2020-08-21 ENCOUNTER — Other Ambulatory Visit: Payer: Self-pay

## 2020-08-21 DIAGNOSIS — M25561 Pain in right knee: Secondary | ICD-10-CM | POA: Diagnosis not present

## 2020-08-21 DIAGNOSIS — R2689 Other abnormalities of gait and mobility: Secondary | ICD-10-CM

## 2020-08-21 DIAGNOSIS — R252 Cramp and spasm: Secondary | ICD-10-CM

## 2020-08-21 DIAGNOSIS — G8929 Other chronic pain: Secondary | ICD-10-CM

## 2020-08-21 NOTE — Therapy (Signed)
[place], [place], [address] Phone: [phone]   Fax:  [phone]  Physical Therapy Treatment  Patient Details  Name: Roberta Jenkins MRN: [id number] Date of Birth: [birth year] Referring Provider (PT): Tiney Rouge PA-C    Encounter Date: 08/21/2020   PT End of Session - 08/21/20 0838    Visit Number 5    Number of Visits 12    Date for PT Re-Evaluation 09/10/20    Authorization Type [employer]% co-insurance    PT Start Time 0800    PT Stop Time 0851    PT Time Calculation (min) 51 min    Equipment Utilized During Treatment Cervical collar           Past Medical History:  Diagnosis Date  . Back pain   . Diabetes mellitus ([place])   . Glaucoma   . Hyperlipidemia   . Menorrhagia 12/17/2011  . No pertinent past medical history     Past Surgical History:  Procedure Laterality Date  . [place]   2x  . MULTIPLE TOOTH EXTRACTIONS      There were no vitals filed for this visit.   Subjective Assessment - 08/21/20 0841    Subjective I had some back pain the other night. It comes and goes. I am doing my exercises.    Pain Score 7     Pain Location Knee    Pain Orientation Right;Medial    Pain Descriptors / Indicators Aching    Pain Score 0    Pain Location Back    Pain Orientation Lower              [facility] PT Assessment - 08/21/20 0001      AROM   Right Knee Flexion 95   104 after manual                         [facility] Adult PT Treatment/Exercise - 08/21/20 0001      Lumbar Exercises: Stretches   Single Knee to Chest Stretch 3 reps;30 seconds    Lower Trunk Rotation 10 seconds    Lower Trunk Rotation Limitations 10 reps       Lumbar Exercises: Supine   Pelvic Tilt 10 reps    Bridge 10 reps      Knee/Hip Exercises: Aerobic   Recumbent Bike partial to full revolutions backward x 5 minutes       Knee/Hip Exercises: Standing   Heel  Raises 10 reps    Knee Flexion 15 reps    Forward Step Up 1 set;15 reps;Hand Hold: 1;Step Height: 4"    SLS 20 sec right    Other Standing Knee Exercises squats at free motion, slow marching for weight shifting, 3 way hip x 10 bilateral, tandem stance 30sec      Knee/Hip Exercises: Seated   Long Arc Quad Right;Left;2 sets;10 reps    Long Arc Quad Limitations red band     Hamstring Curl 2 sets;10 reps    Hamstring Limitations red band around ankles     Sit to [employer] 10 reps   needs UE to rise , cues for controled descent     Knee/Hip Exercises: Supine   Heel Slides 20 reps    Terminal Knee Extension 10 reps    Theraband Level (Terminal Knee Extension) Level 2 (Red)    Straight Leg Raises 15 reps    Straight Leg Raises Limitations with  cues for initial quad set       Moist Heat Therapy   Number Minutes Moist Heat 10 Minutes    Moist Heat Location Knee      Manual Therapy   Passive ROM patella mobs, PROM knee and hip flexion                    PT Short Term Goals - 08/13/20 0908      PT SHORT TERM GOAL #1   Title review FOTO with patient    Baseline reviewed on visit #3    Time 3    Period Weeks    Status Achieved    Target Date 08/20/20      PT SHORT TERM GOAL #2   Title Patient will increase right knee flexion by 25 degrees    Baseline from 55 to 92 08/13/20    Time 3    Period Weeks    Status Achieved    Target Date 08/20/20      PT SHORT TERM GOAL #3   Title Patient will increase right hip passive flexion by 20 degrees (62at eval)    Baseline AROM improved to 90    Time 3    Period Weeks    Status Achieved      PT SHORT TERM GOAL #4   Title Patient will increase gross right LE strength to 4/5    Time 3    Period Weeks    Status Unable to assess             PT Long Term Goals - 07/30/20 1619      PT LONG TERM GOAL #1   Title Patient will return to exercises without pain    Time 6    Period Weeks    Status New    Target Date 09/10/20        PT LONG TERM GOAL #2   Title Patient will stand for 30 minutes without pain    Time 6    Period Weeks    Status New    Target Date 09/10/20      PT LONG TERM GOAL #3   Title Patient will ambualte 3000' without increased pain in order to perfrom ADL's    Time 6    Period Weeks    Status New    Target Date 09/10/20                 Plan - 08/21/20 0839    Clinical Impression Statement Right knee rom continues to improve gradually. Able to complete full revolutions on the rec bike. Began small step ups with good tolerance. Knee rom improved to 104 which allowed for hooklying lumbar mobility today.    PT Next Visit Plan manual therapy to her hip to improve flexion; consider LAD if her knee can tolerate it. It will be a little complicated 2nd to the amount of pain and limited motion in her knee and hip. Caosider gentle knee mbilization to improve motion; light exercises. may have to do standing weight shifts and light hip exercises    PT Home Exercise Plan trigger point release to lumbar spine; heel slide with towel with signifcant education on pain free range           Patient will benefit from skilled therapeutic intervention in order to improve the following deficits and impairments:  Abnormal gait, Difficulty walking, Pain, Decreased activity tolerance, Decreased endurance, Decreased range of motion, Decreased strength, Decreased  mobility  Visit Diagnosis: Chronic pain of right knee  Chronic bilateral low back pain without sciatica  Other abnormalities of gait and mobility  Cramp and spasm     Problem List Patient Active Problem List   Diagnosis Date Noted  . Type 2 diabetes mellitus with hyperglycemia ([place]) 12/19/2019  . Fibroid 09/04/2015  . Menorrhagia 12/17/2011    Dorene Ar, PTA 08/21/2020, 9:31 AM  [facility] [address] [place], [place], [address] Phone: [phone]   Fax:   [phone]  Name: Roberta Jenkins MRN: [id number] Date of Birth: Jul 16, 1961

## 2020-08-26 ENCOUNTER — Other Ambulatory Visit: Payer: Self-pay

## 2020-08-26 ENCOUNTER — Ambulatory Visit: Payer: 59 | Admitting: Physical Therapy

## 2020-08-26 DIAGNOSIS — G8929 Other chronic pain: Secondary | ICD-10-CM

## 2020-08-26 DIAGNOSIS — R2689 Other abnormalities of gait and mobility: Secondary | ICD-10-CM

## 2020-08-26 DIAGNOSIS — M545 Low back pain, unspecified: Secondary | ICD-10-CM

## 2020-08-26 DIAGNOSIS — R252 Cramp and spasm: Secondary | ICD-10-CM

## 2020-08-26 DIAGNOSIS — M25561 Pain in right knee: Secondary | ICD-10-CM | POA: Diagnosis not present

## 2020-08-26 NOTE — Therapy (Addendum)
[place], [place], [address] Phone: [phone]   Fax:  [phone]  Physical Therapy Treatment/Discharge   Patient Details  Name: Roberta Jenkins MRN: [id number] Date of Birth: Oct 14, 1961 Referring Provider (PT): Tiney Rouge PA-C    Encounter Date: 08/26/2020   PT End of Session - 08/26/20 0906    Visit Number 6    Number of Visits 12    Date for PT Re-Evaluation 09/10/20    Authorization Type [employer]30% co-insurance    PT Start Time 0800    PT Stop Time 0900    PT Time Calculation (min) 60 min           Past Medical History:  Diagnosis Date  . Back pain   . Diabetes mellitus ([place])   . Glaucoma   . Hyperlipidemia   . Menorrhagia 12/17/2011  . No pertinent past medical history     Past Surgical History:  Procedure Laterality Date  . [place]   2x  . MULTIPLE TOOTH EXTRACTIONS      There were no vitals filed for this visit.   Subjective Assessment - 08/26/20 0821    Subjective The back and knee are feeling a little better.    Currently in Pain? Yes    Pain Score 6     Pain Location Knee    Pain Orientation Right    Pain Descriptors / Indicators Aching    Pain Type Chronic pain    Pain Score 6    Pain Location Back    Pain Orientation Lower    Pain Descriptors / Indicators Aching              [facility] PT Assessment - 08/26/20 0001      Observation/Other Assessments   Focus on Therapeutic Outcomes (FOTO)  66% limited       AROM   Right Knee Flexion 102                         [facility] Adult PT Treatment/Exercise - 08/26/20 0001      Lumbar Exercises: Stretches   Single Knee to Chest Stretch 3 reps;30 seconds    Lower Trunk Rotation 10 seconds    Lower Trunk Rotation Limitations 10 reps       Lumbar Exercises: Supine   Pelvic Tilt 15 reps    Bent Knee Raise 20 reps    Bent Knee Raise Limitations with abdominal draw      Bridge 20 reps      Knee/Hip Exercises: Stretches   Other Knee/Hip Stretches slant board stretch      Knee/Hip Exercises: Aerobic   Nustep L4 x 5 minutes UE/LE       Knee/Hip Exercises: Standing   Forward Step Up 15 reps;Hand Hold: 1;Step Height: 6"    Other Standing Knee Exercises squats at free motion      Knee/Hip Exercises: Seated   Long Arc Quad Right;Left;2 sets;10 reps    Long Arc Quad Limitations red band     Hamstring Curl 2 sets;10 reps    Hamstring Limitations red band around ankles       Knee/Hip Exercises: Supine   Heel Slides 20 reps    Straight Leg Raises 10 reps;2 sets      Knee/Hip Exercises: Sidelying   Hip ABduction 20 reps      Moist Heat Therapy   Number Minutes Moist Heat 10 Minutes  Moist Heat Location Knee      Manual Therapy   Passive ROM patella mobs, PROM knee and hip flexion                    PT Short Term Goals - 08/13/20 0908      PT SHORT TERM GOAL #1   Title review FOTO with patient    Baseline reviewed on visit #3    Time 3    Period Weeks    Status Achieved    Target Date 08/20/20      PT SHORT TERM GOAL #2   Title Patient will increase right knee flexion by 25 degrees    Baseline from 55 to 92 08/13/20    Time 3    Period Weeks    Status Achieved    Target Date 08/20/20      PT SHORT TERM GOAL #3   Title Patient will increase right hip passive flexion by 20 degrees (62at eval)    Baseline AROM improved to 90    Time 3    Period Weeks    Status Achieved      PT SHORT TERM GOAL #4   Title Patient will increase gross right LE strength to 4/5    Time 3    Period Weeks    Status Unable to assess             PT Long Term Goals - 07/30/20 1619      PT LONG TERM GOAL #1   Title Patient will return to exercises without pain    Time 6    Period Weeks    Status New    Target Date 09/10/20      PT LONG TERM GOAL #2   Title Patient will stand for 30 minutes without pain    Time 6    Period Weeks     Status New    Target Date 09/10/20      PT LONG TERM GOAL #3   Title Patient will ambualte 3000' without increased pain in order to perfrom ADL's    Time 6    Period Weeks    Status New    Target Date 09/10/20                 Plan - 08/26/20 0907    Clinical Impression Statement FOTO score worse by 2 points. She reports she is overall a little better in back and knee. Her AROM knee flexion was 102 today. Added lumbar stabilization exercises with good tolerance.    PT Next Visit Plan REVIEW HER FOTO STATUS ; Check MMT; update HEP;  manual therapy to her hip to improve flexion;  Consider gentle knee mbilization to improve motion; light exercises. may have to do standing weight shifts and light hip exercises    PT Home Exercise Plan trigger point release to lumbar spine; heel slide with strap           Patient will benefit from skilled therapeutic intervention in order to improve the following deficits and impairments:  Abnormal gait, Difficulty walking, Pain, Decreased activity tolerance, Decreased endurance, Decreased range of motion, Decreased strength, Decreased mobility  Visit Diagnosis: Chronic pain of right knee  Chronic bilateral low back pain without sciatica  Other abnormalities of gait and mobility  Cramp and spasm  PHYSICAL THERAPY DISCHARGE SUMMARY  Visits from Start of Care: 6  Current functional level related to goals / functional outcomes: Did not return  since last visit   Remaining deficits: Unknown   Education / Equipment: HEP   Plan: Patient agrees to discharge.  Patient goals were not met. Patient is being discharged due to not returning since the last visit.  ?????       Problem List Patient Active Problem List   Diagnosis Date Noted  . Type 2 diabetes mellitus with hyperglycemia ([place]) 12/19/2019  . Fibroid 09/04/2015  . Menorrhagia 12/17/2011    Dorene Ar, PTA 08/26/2020, 9:46 AM  [facility] [address] [place], [place], [address] Phone: [phone]   Fax:  [phone]  Name: Shelli Portilla MRN: [id number] Date of Birth: [birth year]

## 2020-08-28 ENCOUNTER — Ambulatory Visit: Payer: 59 | Admitting: Physical Therapy

## 2020-09-02 ENCOUNTER — Ambulatory Visit: Payer: 59 | Admitting: Physical Therapy

## 2020-09-04 ENCOUNTER — Ambulatory Visit: Payer: 59 | Admitting: Physical Therapy

## 2020-09-08 ENCOUNTER — Encounter: Payer: Self-pay | Admitting: Physical Therapy

## 2020-09-10 ENCOUNTER — Encounter: Payer: Self-pay | Admitting: Physical Therapy

## 2020-09-11 ENCOUNTER — Ambulatory Visit (INDEPENDENT_AMBULATORY_CARE_PROVIDER_SITE_OTHER): Payer: 59 | Admitting: Orthopaedic Surgery

## 2020-09-11 ENCOUNTER — Ambulatory Visit (INDEPENDENT_AMBULATORY_CARE_PROVIDER_SITE_OTHER): Payer: 59

## 2020-09-11 ENCOUNTER — Encounter: Payer: Self-pay | Admitting: Orthopaedic Surgery

## 2020-09-11 DIAGNOSIS — M1711 Unilateral primary osteoarthritis, right knee: Secondary | ICD-10-CM

## 2020-09-11 DIAGNOSIS — M79604 Pain in right leg: Secondary | ICD-10-CM

## 2020-09-11 MED ORDER — TRAMADOL HCL 50 MG PO TABS: 50.0000 mg | ORAL_TABLET | Freq: Three times a day (TID) | ORAL | 0 refills | Status: DC | PRN

## 2020-09-11 NOTE — Progress Notes (Signed)
Office Visit Note   Patient: Roberta Jenkins           Date of Birth: 1961/05/24           MRN: [id number] Visit Date: 09/11/2020              Requested by: Gildardo Pounds, NP [place],  [place] [address] PCP: Gildardo Pounds, NP   Assessment & Plan: Visit Diagnoses:  1. Unilateral primary osteoarthritis, right knee     Plan: Impression is degenerative joint disease and right lower extremity lumbar radiculopathy.  I believe the patient is having symptoms both from her knee and her back and it is hard to differentiate which is worse.  I think it is appropriate to obtain an MRI of her lumbar spine to further assess for structural abnormalities.  She will follow up with [place] once has been completed.  In the meantime, I will call in a prescription of tramadol to take as needed.  Call with concerns or questions.  Follow-Up Instructions: Return for after lumbar spine MRI.   Orders:  Orders Placed This Encounter  Procedures  . XR Knee Complete 4 Views Right   No orders of the defined types were placed in this encounter.     Procedures: No procedures performed   Clinical Data: No additional findings.   Subjective: Chief Complaint  Patient presents with  . Right Knee - Pain    HPI Roberta Jenkins is a pleasant 59-year-old female who comes in today with recurrent right knee and right lower extremity pain. She has been dealing with this for a while now. She is undergone right knee MRI which showed moderate lateral degenerative changes as well as degeneration of the lateral meniscus. She has had cortisone as well as viscosupplementation injections with mild and temporary relief. We have also worked her up for her lumbar spine with x-rays, steroids and physical therapy without relief of symptoms. She continues to have pain to the mid lower back which radiates to the back of the leg and down the medial knee and into the foot. She has increased pain with stairs as well as  sitting and bending her knee at night. She has tried meloxicam which occasionally helps. She does note numbness and tingling to her right foot. No bowel or bladder dysfunction.  Review of Systems as detailed in HPI. All others reviewed and are negative.   Objective: Vital Signs: LMP 08/09/2018 (Approximate)   Physical Exam well-developed well-nourished female no acute distress. Alert and oriented x3.  Ortho Exam right knee exam shows a small effusion. Range of motion 0 to 100 degrees. Medial and lateral joint line tenderness. Moderate patellofemoral crepitus. Ligaments are stable. She does have a positive straight leg raise. No pain with lumbar flexion, extension or rotation. No spinous tenderness. No focal weakness. She is neurovascular intact distally.  Specialty Comments:  No specialty comments available.  Imaging: XR Knee Complete 4 Views Right  Result Date: 09/11/2020 X-rays demonstrate moderate tricompartmental degenerative changes    PMFS History: Patient Active Problem List   Diagnosis Date Noted  . Type 2 diabetes mellitus with hyperglycemia ([place]) 12/19/2019  . Fibroid 09/04/2015  . Menorrhagia 12/17/2011   Past Medical History:  Diagnosis Date  . Back pain   . Diabetes mellitus ([place])   . Glaucoma   . Hyperlipidemia   . Menorrhagia 12/17/2011  . No pertinent past medical history     Family History  Problem Relation Age of  Onset  . Cancer Mother        colon  . Cancer Father   . Kidney disease Sister   . Hypertension Brother     Past Surgical History:  Procedure Laterality Date  . [place]   2x  . MULTIPLE TOOTH EXTRACTIONS     Social History   Occupational History  . Not on file  Tobacco Use  . Smoking status: Never Smoker  . Smokeless tobacco: Never Used  Vaping Use  . Vaping Use: Never used  Substance and Sexual Activity  . Alcohol use: No  . Drug use: No  . Sexual activity: Never    Birth control/protection: Abstinence     Comment: last intercourse was 5 yrs ago

## 2020-10-03 ENCOUNTER — Ambulatory Visit
Admission: RE | Admit: 2020-10-03 | Discharge: 2020-10-03 | Disposition: A | Discharge status: Home / Self Care | Payer: 59 | Source: Ambulatory Visit | Attending: Orthopaedic Surgery | Admitting: Orthopaedic Surgery

## 2020-10-03 ENCOUNTER — Other Ambulatory Visit: Payer: Self-pay

## 2020-10-03 DIAGNOSIS — M79604 Pain in right leg: Secondary | ICD-10-CM

## 2020-10-07 ENCOUNTER — Other Ambulatory Visit: Payer: Self-pay

## 2020-10-07 ENCOUNTER — Encounter: Payer: Self-pay | Admitting: Orthopaedic Surgery

## 2020-10-07 ENCOUNTER — Ambulatory Visit (INDEPENDENT_AMBULATORY_CARE_PROVIDER_SITE_OTHER): Payer: 59 | Admitting: Orthopaedic Surgery

## 2020-10-07 VITALS — Ht 67.0 in | Wt 175.0 lb

## 2020-10-07 DIAGNOSIS — M5416 Radiculopathy, lumbar region: Secondary | ICD-10-CM | POA: Diagnosis not present

## 2020-10-07 NOTE — Progress Notes (Signed)
   Office Visit Note   Patient: Roberta Jenkins           Date of Birth: [birth year]           MRN: [id number] Visit Date: 10/07/2020              Requested by: Gildardo Pounds, NP [place],  [place] [address] PCP: Gildardo Pounds, NP   Assessment & Plan: Visit Diagnoses:  1. Lumbar radiculopathy     Plan: Impression is right sided lumbar radiculopathy with MRI findings showing prominent facet degenerative changes L4-5 and L5-S1 as well as mild left neuroforaminal narrowing L3-4.  At this point, we will refer her to Dr. Ernestina Patches for [facility] versus facet block.  She will follow with [place] as needed.  Follow-Up Instructions: Return if symptoms worsen or fail to improve.   Orders:  No orders of the defined types were placed in this encounter.  No orders of the defined types were placed in this encounter.     Procedures: No procedures performed   Clinical Data: No additional findings.   Subjective: Chief Complaint  Patient presents with  . Lower Back - Follow-up    MRI review    HPI patient is a pleasant 59 year old female who comes in today to discuss MRI results of her lumbar spine.  She has been dealing with right lower extremity radiculopathy as well as underlying right knee osteoarthritis.  She has had right knee cortisone viscosupplementation injections without relief of symptoms.  She has been on a steroid and sent to physical therapy for her back without significant improvement.  Recent MRI of the lumbar spine shows prominent facet degenerative changes L4-5 and L5-S1, mild left neuroforaminal narrowing L3-4.  She has not previously had an epidural steroid injection.     Objective: Vital Signs: Ht 5\' 7"  (1.702 m)   Wt 175 lb (79.4 kg)   LMP 08/09/2018 (Approximate)   BMI 27.41 kg/m     Ortho Exam stable lumbar exam  Specialty Comments:  No specialty comments available.  Imaging: No new imaging   PMFS History: Patient Active Problem List    Diagnosis Date Noted  . Type 2 diabetes mellitus with hyperglycemia ([place]) 12/19/2019  . Fibroid 09/04/2015  . Menorrhagia 12/17/2011   Past Medical History:  Diagnosis Date  . Back pain   . Diabetes mellitus ([place])   . Glaucoma   . Hyperlipidemia   . Menorrhagia 12/17/2011  . No pertinent past medical history     Family History  Problem Relation Age of Onset  . Cancer Mother        colon  . Cancer Father   . Kidney disease Sister   . Hypertension Brother     Past Surgical History:  Procedure Laterality Date  . [place]   2x  . MULTIPLE TOOTH EXTRACTIONS     Social History   Occupational History  . Not on file  Tobacco Use  . Smoking status: Never Smoker  . Smokeless tobacco: Never Used  Vaping Use  . Vaping Use: Never used  Substance and Sexual Activity  . Alcohol use: No  . Drug use: No  . Sexual activity: Never    Birth control/protection: Abstinence    Comment: last intercourse was 5 yrs ago

## 2020-10-08 ENCOUNTER — Other Ambulatory Visit: Payer: Self-pay

## 2020-10-08 DIAGNOSIS — M5416 Radiculopathy, lumbar region: Secondary | ICD-10-CM

## 2020-10-10 ENCOUNTER — Telehealth: Payer: Self-pay | Admitting: Physical Medicine and Rehabilitation

## 2020-10-10 NOTE — Telephone Encounter (Signed)
Pt called about setting an apt with Dr. Ernestina Patches regarding a referral she has if she has one at all ?  Please call and advise

## 2020-10-13 NOTE — Telephone Encounter (Signed)
Called pt and sch 1/10.

## 2020-10-14 ENCOUNTER — Telehealth: Payer: Self-pay | Admitting: Orthopaedic Surgery

## 2020-10-14 NOTE — Telephone Encounter (Signed)
Noted  

## 2020-10-14 NOTE — Telephone Encounter (Signed)
See below, disregard

## 2020-10-14 NOTE — Telephone Encounter (Signed)
Patient called asked if she can get the gel injection in both of her knees. The number to contact patient is [phone]

## 2020-10-14 NOTE — Telephone Encounter (Signed)
Forwarding S/T disability paperwork to [facility] today

## 2020-10-14 NOTE — Telephone Encounter (Signed)
Please disregard previous message - Error

## 2020-11-05 ENCOUNTER — Telehealth: Payer: Self-pay | Admitting: Nurse Practitioner

## 2020-11-05 DIAGNOSIS — Z794 Long term (current) use of insulin: Secondary | ICD-10-CM

## 2020-11-05 DIAGNOSIS — E1165 Type 2 diabetes mellitus with hyperglycemia: Secondary | ICD-10-CM

## 2020-11-05 NOTE — Telephone Encounter (Signed)
Pt came to the office to request   insulin glargine (LANTUS SOLOSTAR) 100 UNIT/ML Solostar Pen  Lancets (ONETOUCH DELICA PLUS LANCET33G) MISC  insulin glargine (LANTUS SOLOSTAR) 100 UNIT/ML Solostar Pen Please send it to [id number] - [place] ([place]), [place] - [address]  [address], [place] ([place] [address] , please inform the Pt when has been send

## 2020-11-06 MED ORDER — ONETOUCH DELICA PLUS LANCET33G MISC: 0 refills | Status: DC

## 2020-11-06 MED ORDER — LANTUS SOLOSTAR 100 UNIT/ML ~~LOC~~ SOPN: 18.0000 [IU] | PEN_INJECTOR | Freq: Every day | SUBCUTANEOUS | 0 refills | Status: DC

## 2020-11-06 NOTE — Telephone Encounter (Signed)
Pt needs an appointment. I sent 1 fill but she will need to at least have an appt scheduled for additional refills.

## 2020-11-10 ENCOUNTER — Ambulatory Visit: Payer: 59 | Admitting: Physical Medicine and Rehabilitation

## 2020-11-12 ENCOUNTER — Ambulatory Visit: Payer: Self-pay | Admitting: Physical Medicine and Rehabilitation

## 2020-12-09 ENCOUNTER — Telehealth: Payer: Self-pay | Admitting: Physical Medicine and Rehabilitation

## 2020-12-09 NOTE — Telephone Encounter (Signed)
Called pt and couldn't leave a voice mail. #1

## 2020-12-09 NOTE — Telephone Encounter (Signed)
Patient would like an appointment with Dr. Ernestina Patches. Her number is [phone]

## 2020-12-10 NOTE — Telephone Encounter (Signed)
Called pt and LVM #2 

## 2020-12-12 NOTE — Telephone Encounter (Signed)
Called pt and sch 2/24

## 2020-12-18 ENCOUNTER — Ambulatory Visit: Payer: 59 | Admitting: Orthopaedic Surgery

## 2020-12-25 ENCOUNTER — Ambulatory Visit: Payer: Self-pay

## 2020-12-25 ENCOUNTER — Ambulatory Visit (INDEPENDENT_AMBULATORY_CARE_PROVIDER_SITE_OTHER): Payer: 59 | Admitting: Physical Medicine and Rehabilitation

## 2020-12-25 ENCOUNTER — Encounter: Payer: Self-pay | Admitting: Physical Medicine and Rehabilitation

## 2020-12-25 ENCOUNTER — Other Ambulatory Visit: Payer: Self-pay

## 2020-12-25 VITALS — BP 113/82 | HR 79

## 2020-12-25 DIAGNOSIS — M5416 Radiculopathy, lumbar region: Secondary | ICD-10-CM

## 2020-12-25 DIAGNOSIS — M47816 Spondylosis without myelopathy or radiculopathy, lumbar region: Secondary | ICD-10-CM | POA: Diagnosis not present

## 2020-12-25 MED ORDER — METHYLPREDNISOLONE ACETATE 80 MG/ML IJ SUSP
80.0000 mg | Freq: Once | INTRAMUSCULAR | Status: AC
  Administered 2020-12-25: 80 mg

## 2020-12-25 NOTE — Progress Notes (Signed)
Roberta Jenkins - 60 y.o. female MRN [id number]  Date of birth: 03-18-1961  Office Visit Note: Visit Date: 12/25/2020 PCP: Gildardo Pounds, NP Referred by: Gildardo Pounds, NP  Subjective: Chief Complaint  Patient presents with  . Lower Back - Pain   HPI:  [name] is a 60 y.o. female who comes in today at the request of Dr. Eduard Roux for planned Left  L4-L5 and L5-S1 Lumbar facet/medial branch block with fluoroscopic guidance.  The patient has failed conservative care including home exercise, medications, time and activity modification.  This injection will be diagnostic and hopefully therapeutic.  Please see requesting physician notes for further details and justification.  Exam has shown concordant pain with facet joint loading. MRI reviewed with images and spine model.  MRI reviewed in the note below.  Clinical interview and exam more consistent with facet mediated low back pain.  Does have pain with facet loading.  MRI consistent with this.  She does have small disc bulging asymmetric to the left but without significant nerve compression.  Depending on relief could consider epidural injection 1 time.  ROS Otherwise per HPI.  Assessment & Plan: Visit Diagnoses:    ICD-10-CM   1. Spondylosis without myelopathy or radiculopathy, lumbar region  M47.816 XR C-ARM NO REPORT    methylPREDNISolone acetate (DEPO-MEDROL) injection 80 mg    Facet Injection  2. Lumbar radiculopathy  M54.16 CANCELED: Epidural Steroid injection    Plan: No additional findings.   Meds & Orders:  Meds ordered this encounter  Medications  . methylPREDNISolone acetate (DEPO-MEDROL) injection 80 mg    Orders Placed This Encounter  Procedures  . Facet Injection  . XR C-ARM NO REPORT    Follow-up: Return if symptoms worsen or fail to improve.   Procedures: No procedures performed  Lumbar Facet Joint Intra-Articular Injection(s) with Fluoroscopic Guidance  Patient: Roberta Jenkins      Date of Birth: 1961-09-19 MRN: [id number] PCP: Gildardo Pounds, NP      Visit Date: 12/25/2020   Universal Protocol:    Date/Time: 12/25/2020  Consent Given By: the patient  Position: PRONE   Additional Comments: Vital signs were monitored before and after the procedure. Patient was prepped and draped in the usual sterile fashion. The correct patient, procedure, and site was verified.   Injection Procedure Details:  Procedure Site One Meds Administered:  Meds ordered this encounter  Medications  . methylPREDNISolone acetate (DEPO-MEDROL) injection 80 mg     Laterality: Left  Location/Site:  L4-L5 L5-S1  Needle size: 22 guage  Needle type: Spinal  Needle Placement: Articular  Findings:  -Comments: Excellent flow of contrast producing a partial arthrogram.  Procedure Details: The fluoroscope beam is vertically oriented in AP, and the inferior recess is visualized beneath the lower pole of the inferior apophyseal process, which represents the target point for needle insertion. When direct visualization is difficult the target point is located at the medial projection of the vertebral pedicle. The region overlying each aforementioned target is locally anesthetized with a 1 to 2 ml. volume of 1% Lidocaine without Epinephrine.   The spinal needle was inserted into each of the above mentioned facet joints using biplanar fluoroscopic guidance. A 0.25 to 0.5 ml. volume of Isovue-250 was injected and a partial facet joint arthrogram was obtained. A single spot film was obtained of the resulting arthrogram.    One to 1.25 ml of the steroid/anesthetic solution was then injected into each of  the facet joints noted above.   Additional Comments:  The patient tolerated the procedure well Dressing: 2 x 2 sterile gauze and Band-Aid    Post-procedure details: Patient was observed during the procedure. Post-procedure instructions were reviewed.  Patient left the  clinic in stable condition.     Clinical History: MRI LUMBAR SPINE WITHOUT CONTRAST  TECHNIQUE: Multiplanar, multisequence MR imaging of the lumbar spine was performed. No intravenous contrast was administered.  COMPARISON:  Plain films July 28, 2020.  FINDINGS: Segmentation:  Standard.  Alignment: Trace anterolisthesis of L4 over L5. Mild levoconvex scoliosis of the lumbar spine.  Vertebrae:  No fracture, evidence of discitis, or bone lesion.  Conus medullaris and cauda equina: Conus extends to the L2 level. Conus and cauda equina appear normal.  Paraspinal and other soft tissues: Negative.  Disc levels:  T12-L1: No spinal canal or neural foraminal stenosis.  L1-2: No spinal canal or neural foraminal stenosis.  L2-3: No spinal canal or neural foraminal stenosis.  L3-4: Left asymmetric disc bulge and mild facet degenerative changes resulting in mild narrowing of the left neural foramen. No significant spinal canal stenosis.  L4-5: Disc bulge/disc uncovering and prominent facet degenerative changes with ligamentum flavum redundancy without significant spinal canal or neural foraminal stenosis.  L5-S1: Shallow disc bulge prominent facet degenerative changes, right greater than left on a without significant spinal canal or neural foraminal stenosis.  IMPRESSION: 1. Prominent facet degenerative changes at L4-L5 and L5-S1. 2. Mild left neural foraminal narrowing at L3-L4. 3. No high-grade spinal canal or neural foraminal stenosis at any level.   Electronically Signed   By: Pedro Earls M.D.   On: 10/03/2020 10:19     Objective:  VS:  HT:    WT:   BMI:     BP:113/82  HR:79bpm  TEMP: ( )  RESP:  Physical Exam Vitals and nursing note reviewed.  Constitutional:      General: She is not in acute distress.    Appearance: Normal appearance. She is obese. She is not ill-appearing.  HENT:     Head: Normocephalic and  atraumatic.     Right Ear: External ear normal.     Left Ear: External ear normal.  Eyes:     Extraocular Movements: Extraocular movements intact.  Cardiovascular:     Rate and Rhythm: Normal rate.     Pulses: Normal pulses.  Pulmonary:     Effort: Pulmonary effort is normal. No respiratory distress.  Abdominal:     General: There is no distension.     Palpations: Abdomen is soft.  Musculoskeletal:        General: Tenderness present.     Cervical back: Neck supple.     Right lower leg: No edema.     Left lower leg: No edema.     Comments: Patient has good distal strength with no pain over the greater trochanters.  No clonus or focal weakness. Patient somewhat slow to rise from a seated position to full extension.  There is concordant low back pain with facet loading and lumbar spine extension rotation.  There are no definitive trigger points but the patient is somewhat tender across the lower back and PSIS.  There is no pain with hip rotation.   Skin:    Findings: No erythema, lesion or rash.  Neurological:     General: No focal deficit present.     Mental Status: She is alert and oriented to person, place, and time.  Sensory: No sensory deficit.     Motor: No weakness or abnormal muscle tone.     Coordination: Coordination normal.  Psychiatric:        Mood and Affect: Mood normal.        Behavior: Behavior normal.      Imaging: No results found.

## 2020-12-25 NOTE — Patient Instructions (Signed)

## 2020-12-25 NOTE — Progress Notes (Signed)
Pt state lower back pain that mostly on her left side. Pt state bending, standing straight up and climbing stairs makes the pain worse. Pt state she just deals with the pain.  Numeric Pain Rating Scale and Functional Assessment Average Pain 7   In the last MONTH (on 0-10 scale) has pain interfered with the following?  1. General activity like being  able to carry out your everyday physical activities such as walking, climbing stairs, carrying groceries, or moving a chair?  Rating(10)   +Driver, -BT, -Dye Allergies.

## 2020-12-30 ENCOUNTER — Other Ambulatory Visit: Payer: Self-pay

## 2020-12-30 ENCOUNTER — Ambulatory Visit (INDEPENDENT_AMBULATORY_CARE_PROVIDER_SITE_OTHER): Payer: 59 | Admitting: Orthopaedic Surgery

## 2020-12-30 ENCOUNTER — Encounter: Payer: Self-pay | Admitting: Orthopaedic Surgery

## 2020-12-30 VITALS — Ht 67.0 in | Wt 175.0 lb

## 2020-12-30 DIAGNOSIS — M1711 Unilateral primary osteoarthritis, right knee: Secondary | ICD-10-CM

## 2020-12-30 MED ORDER — TRAMADOL HCL 50 MG PO TABS
50.0000 mg | ORAL_TABLET | Freq: Three times a day (TID) | ORAL | 0 refills | Status: DC | PRN
Start: 2020-12-30 — End: 2021-05-01

## 2020-12-30 NOTE — Progress Notes (Signed)
Office Visit Note   Patient: Roberta Jenkins           Date of Birth: 07-04-1961           MRN: [id number] Visit Date: 12/30/2020              Requested by: Gildardo Pounds, NP [place],  [place] [address] PCP: Gildardo Pounds, NP   Assessment & Plan: Visit Diagnoses:  1. Primary osteoarthritis of right knee     Plan: Impression is right knee degenerative joint disease with underlying lateral meniscus tear.  We did discuss knee aspiration and cortisone injection today but the patient notes her symptoms are not severe enough to proceed with this.  She would like to try to take tramadol as needed.  Should her symptoms worsen, she will follow-up for aspiration and injection.  Call with concerns or questions in meantime.  Follow-Up Instructions: Return if symptoms worsen or fail to improve.   Orders:  No orders of the defined types were placed in this encounter.  Meds ordered this encounter  Medications  . traMADol (ULTRAM) 50 MG tablet    Sig: Take 1 tablet (50 mg total) by mouth 3 (three) times daily as needed.    Dispense:  40 tablet    Refill:  0      Procedures: No procedures performed   Clinical Data: No additional findings.   Subjective: Chief Complaint  Patient presents with  . Right Knee - Pain    HPI patient is a pleasant 60 year old female who comes in today with recurrent right knee pain.  She does have a history of underlying degenerative joint disease as well as a lateral meniscus tear seen on MRI from last year.  She has had intermittent cortisone injections which have been of mild relief.  She has also been experiencing right lower extremity radiculopathy for which she has undergone epidural steroid injection by Dr. Ernestina Patches last week.  She does seem to think this is helping that pain.  The pain to her right knee is occurring primarily with activity and steps.  She is taking over-the-counter medication without significant relief.  She does  note relief with tramadol in the past.   Review of Systems as detailed in HPI.  All others reviewed and are negative.   Objective: Vital Signs: Ht 5\' 7"  (1.702 m)   Wt 175 lb (79.4 kg)   LMP 08/09/2018 (Approximate)   BMI 27.41 kg/m   Physical Exam well-developed well-nourished female no acute distress.  Alert and oriented x3.  Ortho Exam right knee exam shows a small to moderate effusion.  Range of motion 0 to 110 degrees.  Medial and lateral joint line tenderness.  Ligaments are stable.  She is neurovascular intact distally.  Specialty Comments:  No specialty comments available.  Imaging: No new imaging   PMFS History: Patient Active Problem List   Diagnosis Date Noted  . Type 2 diabetes mellitus with hyperglycemia ([place]) 12/19/2019  . Fibroid 09/04/2015  . Menorrhagia 12/17/2011   Past Medical History:  Diagnosis Date  . Back pain   . Diabetes mellitus ([place])   . Glaucoma   . Hyperlipidemia   . Menorrhagia 12/17/2011  . No pertinent past medical history     Family History  Problem Relation Age of Onset  . Cancer Mother        colon  . Cancer Father   . Kidney disease Sister   . Hypertension Brother  Past Surgical History:  Procedure Laterality Date  . [place]   2x  . MULTIPLE TOOTH EXTRACTIONS     Social History   Occupational History  . Not on file  Tobacco Use  . Smoking status: Never Smoker  . Smokeless tobacco: Never Used  Vaping Use  . Vaping Use: Never used  Substance and Sexual Activity  . Alcohol use: No  . Drug use: No  . Sexual activity: Never    Birth control/protection: Abstinence    Comment: last intercourse was 5 yrs ago

## 2020-12-30 NOTE — Procedures (Signed)
Lumbar Facet Joint Intra-Articular Injection(s) with Fluoroscopic Guidance  Patient: Roberta Jenkins      Date of Birth: 1961/01/06 MRN: [id number] PCP: Gildardo Pounds, NP      Visit Date: 12/25/2020   Universal Protocol:    Date/Time: 12/25/2020  Consent Given By: the patient  Position: PRONE   Additional Comments: Vital signs were monitored before and after the procedure. Patient was prepped and draped in the usual sterile fashion. The correct patient, procedure, and site was verified.   Injection Procedure Details:  Procedure Site One Meds Administered:  Meds ordered this encounter  Medications  . methylPREDNISolone acetate (DEPO-MEDROL) injection 80 mg     Laterality: Left  Location/Site:  L4-L5 L5-S1  Needle size: 22 guage  Needle type: Spinal  Needle Placement: Articular  Findings:  -Comments: Excellent flow of contrast producing a partial arthrogram.  Procedure Details: The fluoroscope beam is vertically oriented in AP, and the inferior recess is visualized beneath the lower pole of the inferior apophyseal process, which represents the target point for needle insertion. When direct visualization is difficult the target point is located at the medial projection of the vertebral pedicle. The region overlying each aforementioned target is locally anesthetized with a 1 to 2 ml. volume of 1% Lidocaine without Epinephrine.   The spinal needle was inserted into each of the above mentioned facet joints using biplanar fluoroscopic guidance. A 0.25 to 0.5 ml. volume of Isovue-250 was injected and a partial facet joint arthrogram was obtained. A single spot film was obtained of the resulting arthrogram.    One to 1.25 ml of the steroid/anesthetic solution was then injected into each of the facet joints noted above.   Additional Comments:  The patient tolerated the procedure well Dressing: 2 x 2 sterile gauze and Band-Aid    Post-procedure details: Patient was  observed during the procedure. Post-procedure instructions were reviewed.  Patient left the clinic in stable condition.

## 2020-12-31 ENCOUNTER — Encounter (HOSPITAL_COMMUNITY): Payer: Self-pay

## 2020-12-31 ENCOUNTER — Other Ambulatory Visit: Payer: Self-pay

## 2020-12-31 ENCOUNTER — Other Ambulatory Visit: Payer: Self-pay | Admitting: Pharmacist

## 2020-12-31 ENCOUNTER — Emergency Department (HOSPITAL_COMMUNITY)
Admission: EM | Admit: 2020-12-31 | Discharge: 2020-12-31 | Disposition: A | Discharge status: Home / Self Care | Payer: 59 | Attending: Emergency Medicine | Admitting: Emergency Medicine

## 2020-12-31 DIAGNOSIS — R519 Headache, unspecified: Secondary | ICD-10-CM | POA: Diagnosis not present

## 2020-12-31 DIAGNOSIS — E1165 Type 2 diabetes mellitus with hyperglycemia: Secondary | ICD-10-CM

## 2020-12-31 DIAGNOSIS — H5712 Ocular pain, left eye: Secondary | ICD-10-CM | POA: Insufficient documentation

## 2020-12-31 DIAGNOSIS — Z7984 Long term (current) use of oral hypoglycemic drugs: Secondary | ICD-10-CM | POA: Diagnosis not present

## 2020-12-31 DIAGNOSIS — R739 Hyperglycemia, unspecified: Secondary | ICD-10-CM

## 2020-12-31 DIAGNOSIS — Z794 Long term (current) use of insulin: Secondary | ICD-10-CM | POA: Diagnosis not present

## 2020-12-31 DIAGNOSIS — E785 Hyperlipidemia, unspecified: Secondary | ICD-10-CM

## 2020-12-31 DIAGNOSIS — H5711 Ocular pain, right eye: Secondary | ICD-10-CM | POA: Diagnosis not present

## 2020-12-31 DIAGNOSIS — E119 Type 2 diabetes mellitus without complications: Secondary | ICD-10-CM | POA: Insufficient documentation

## 2020-12-31 LAB — CBG MONITORING, ED: Glucose-Capillary: 169 mg/dL — ABNORMAL HIGH (ref 70–99)

## 2020-12-31 MED ORDER — ATORVASTATIN CALCIUM 20 MG PO TABS: 20.0000 mg | ORAL_TABLET | Freq: Every day | ORAL | 0 refills | Status: DC

## 2020-12-31 MED ORDER — TETRACAINE HCL 0.5 % OP SOLN
2.0000 [drp] | Freq: Once | OPHTHALMIC | Status: AC
  Administered 2020-12-31: 2 [drp] via OPHTHALMIC
  Filled 2020-12-31: qty 4

## 2020-12-31 MED ORDER — LANTUS SOLOSTAR 100 UNIT/ML ~~LOC~~ SOPN: 18.0000 [IU] | PEN_INJECTOR | Freq: Every day | SUBCUTANEOUS | 0 refills | Status: DC

## 2020-12-31 MED ORDER — METFORMIN HCL 500 MG PO TABS: 1000.0000 mg | ORAL_TABLET | Freq: Every day | ORAL | 0 refills | Status: DC

## 2020-12-31 NOTE — Discharge Instructions (Addendum)
Call your ophthalmologist today to schedule a follow-up visit

## 2020-12-31 NOTE — ED Triage Notes (Signed)
Pt arrived POV, c/o headache and eye burning and irritation x3 weeks.

## 2020-12-31 NOTE — ED Provider Notes (Addendum)
[facility] DEPT Provider Note   CSN: [id number] Arrival date & time: 12/31/20  0800     History Chief Complaint  Patient presents with  . Headache  . Eye Pain    Roberta Jenkins is a 59 y.o. female.  60 year old female presents with greater than 4-week history of bilateral eye pain with itching and discomfort.  Patient has a history of glaucoma and has not been on her regular medications for that time.  She is had a mild headache without vomiting.  Denies any vision loss.  Patient was diagnosed with dry eyes and has been using over-the-counter medications for this.  Patient had been scheduled see her eye doctor several days ago but missed that appointment.        Past Medical History:  Diagnosis Date  . Back pain   . Diabetes mellitus ([place])   . Glaucoma   . Hyperlipidemia   . Menorrhagia 12/17/2011  . No pertinent past medical history     Patient Active Problem List   Diagnosis Date Noted  . Type 2 diabetes mellitus with hyperglycemia ([place]) 12/19/2019  . Fibroid 09/04/2015  . Menorrhagia 12/17/2011    Past Surgical History:  Procedure Laterality Date  . [place]   2x  . MULTIPLE TOOTH EXTRACTIONS       OB History    Gravida  4   Para  3   Term  3   Preterm      AB  1   Living  3     SAB  1   IAB      Ectopic      Multiple      Live Births              Family History  Problem Relation Age of Onset  . Cancer Mother        colon  . Cancer Father   . Kidney disease Sister   . Hypertension Brother     Social History   Tobacco Use  . Smoking status: Never Smoker  . Smokeless tobacco: Never Used  Vaping Use  . Vaping Use: Never used  Substance Use Topics  . Alcohol use: No  . Drug use: No    Home Medications Prior to Admission medications   Medication Sig Start Date End Date Taking? Authorizing Provider  atorvastatin (LIPITOR) 20 MG tablet Take 1 tablet (20 mg total) by  mouth daily. 07/23/20   Gildardo Pounds, NP  glucose blood [facility] VERIO) test strip Use to check blood sugar TID. E11.65 07/23/20   Gildardo Pounds, NP  insulin glargine (LANTUS SOLOSTAR) 100 UNIT/ML Solostar Pen Inject 18 Units into the skin daily. 11/06/20   Charlott Rakes, MD  Insulin Pen Needle (TRUEPLUS 5-BEVEL PEN NEEDLES) 32G X 4 MM MISC Use to inject Basaglar once daily. E11.65 05/16/20   Charlott Rakes, MD  Lancets [facility] DELICA PLUS [id number]) MISC Use to check blood sugar TID. E11.65 11/06/20   Charlott Rakes, MD  latanoprost (XALATAN) 0.005 % ophthalmic solution SMARTSIG:1 Drop(s) In Eye(s) Every Evening 05/16/20   [provider]  metFORMIN (GLUCOPHAGE) 500 MG tablet Take 2 tablets (1,000 mg total) by mouth daily with breakfast. 07/23/20 11/20/20  Gildardo Pounds, NP  Multiple Vitamin (MULTIVITAMIN WITH MINERALS) TABS tablet Take 1 tablet by mouth daily.    [provider]  predniSONE (STERAPRED UNI-PAK 21 TAB) 10 MG (21) TBPK tablet Take as directed 07/28/20  Aundra Dubin, PA-C  traMADol (ULTRAM) 50 MG tablet Take 1 tablet (50 mg total) by mouth 3 (three) times daily as needed. 12/30/20   Aundra Dubin, PA-C  Travoprost, BAK Free, (TRAVATAN) 0.004 % SOLN ophthalmic solution Place 1 drop into both eyes at bedtime. Samples    [provider]    Allergies    Patient has no known allergies.  Review of Systems   Review of Systems  All other systems reviewed and are negative.   Physical Exam Updated Vital Signs BP 122/85 (BP Location: Right Arm)   Pulse 70   Temp 98.3 F (36.8 C) (Oral)   Resp 20   LMP 08/09/2018 (Approximate)   SpO2 100%   Physical Exam Vitals and nursing note reviewed.  Constitutional:      General: She is not in acute distress.    Appearance: Normal appearance. She is well-developed and well-nourished. She is not toxic-appearing.  HENT:     Head: Normocephalic and atraumatic.  Eyes:     General: Lids are normal.      Extraocular Movements: Extraocular movements intact and EOM normal.     Conjunctiva/sclera: Conjunctivae normal.     Pupils: Pupils are equal, round, and reactive to light.  Neck:     Thyroid: No thyroid mass.     Trachea: No tracheal deviation.  Cardiovascular:     Rate and Rhythm: Normal rate and regular rhythm.     Heart sounds: Normal heart sounds. No murmur heard. No gallop.   Pulmonary:     Effort: Pulmonary effort is normal. No respiratory distress.     Breath sounds: Normal breath sounds. No stridor. No decreased breath sounds, wheezing, rhonchi or rales.  Abdominal:     General: Bowel sounds are normal. There is no distension.     Palpations: Abdomen is soft.     Tenderness: There is no abdominal tenderness. There is no CVA tenderness or rebound.  Musculoskeletal:        General: No tenderness or edema. Normal range of motion.     Cervical back: Normal range of motion and neck supple.  Skin:    General: Skin is warm and dry.     Findings: No abrasion or rash.  Neurological:     Mental Status: She is alert and oriented to person, place, and time.     GCS: GCS eye subscore is 4. GCS verbal subscore is 5. GCS motor subscore is 6.     Cranial Nerves: No cranial nerve deficit.     Sensory: No sensory deficit.     Deep Tendon Reflexes: Strength normal.  Psychiatric:        Mood and Affect: Mood and affect normal.        Speech: Speech normal.        Behavior: Behavior normal.     ED Results / Procedures / Treatments   Labs (all labs ordered are listed, but only abnormal results are displayed) Labs Reviewed  CBG MONITORING, ED    EKG None  Radiology No results found.  Procedures Procedures   Medications Ordered in ED Medications - No data to display  ED Course  I have reviewed the triage vital signs and the nursing notes.  Pertinent labs & imaging results that were available during my care of the patient were reviewed by me and considered in my medical  decision making (see chart for details).    MDM Rules/Calculators/A&P  Patient's blood sugar is 169 Patient's intraocular pressure was 15 in the left and 14 in the right.  No suspicion for acute angle-closure glaucoma.  Patient is directed to follow-up with her ophthalmologist. Final Clinical Impression(s) / ED Diagnoses Final diagnoses:  None    Rx / DC Orders ED Discharge Orders    None       Lacretia Leigh, MD 12/31/20 [id number]    Lacretia Leigh, MD 12/31/20 [phone]

## 2021-01-29 ENCOUNTER — Ambulatory Visit: Payer: 59 | Attending: Physician Assistant | Admitting: Physician Assistant

## 2021-01-29 ENCOUNTER — Encounter: Payer: Self-pay | Admitting: Physician Assistant

## 2021-01-29 ENCOUNTER — Other Ambulatory Visit: Payer: Self-pay

## 2021-01-29 VITALS — BP 122/77 | HR 77 | Resp 18 | Ht 68.0 in | Wt 189.0 lb

## 2021-01-29 DIAGNOSIS — Z794 Long term (current) use of insulin: Secondary | ICD-10-CM

## 2021-01-29 DIAGNOSIS — E1165 Type 2 diabetes mellitus with hyperglycemia: Secondary | ICD-10-CM | POA: Diagnosis not present

## 2021-01-29 DIAGNOSIS — E785 Hyperlipidemia, unspecified: Secondary | ICD-10-CM

## 2021-01-29 LAB — POCT GLYCOSYLATED HEMOGLOBIN (HGB A1C): HbA1c, POC (controlled diabetic range): 7.4 % — AB (ref 0.0–7.0)

## 2021-01-29 LAB — GLUCOSE, POCT (MANUAL RESULT ENTRY): POC Glucose: 151 mg/dl — AB (ref 70–99)

## 2021-01-29 MED ORDER — LANTUS SOLOSTAR 100 UNIT/ML ~~LOC~~ SOPN
16.0000 [IU] | PEN_INJECTOR | Freq: Every day | SUBCUTANEOUS | 5 refills | Status: DC
Start: 2021-01-29 — End: 2021-05-01

## 2021-01-29 MED ORDER — ONETOUCH DELICA PLUS LANCET33G MISC: 0 refills | Status: DC

## 2021-01-29 MED ORDER — TRUEPLUS 5-BEVEL PEN NEEDLES 32G X 4 MM MISC
2 refills | Status: DC
Start: 2021-01-29 — End: 2021-05-01

## 2021-01-29 MED ORDER — ONETOUCH VERIO VI STRP: ORAL_STRIP | 12 refills | Status: DC

## 2021-01-29 MED ORDER — METFORMIN HCL 500 MG PO TABS: 1000.0000 mg | ORAL_TABLET | Freq: Every day | ORAL | 0 refills | Status: DC

## 2021-01-29 MED ORDER — ATORVASTATIN CALCIUM 20 MG PO TABS: 20.0000 mg | ORAL_TABLET | Freq: Every day | ORAL | 1 refills | Status: DC

## 2021-01-29 NOTE — Progress Notes (Signed)
Patient ID: Roberta Jenkins, female   DOB: [birth year], 60 y.o.   MRN: [id number]   Roberta Jenkins, is a 60 y.o. female  GEX:[id number]  [id number]:[id number]  DOB - 03-Feb-1961  Subjective:  Chief Complaint and HPI: Roberta Jenkins is a 60 y.o. female here today for med RF.  She has not been checking her blood sugars.  Also, she was off her insulin completely until a couple of weeks ago.  She restarted it a couple of weeks ago.  No new issues or concerns.  Not checking blood sugars bc out of strips.    ROS:   Constitutional:  No f/c, No night sweats, No unexplained weight loss. EENT:  No vision changes, No blurry vision, No hearing changes. No mouth, throat, or ear problems.  Respiratory: No cough, No SOB Cardiac: No CP, no palpitations GI:  No abd pain, No N/V/D. GU: No Urinary s/sx Musculoskeletal: No joint pain Neuro: No headache, no dizziness, no motor weakness.  Skin: No rash Endocrine:  No polydipsia. No polyuria.  Psych: Denies SI/HI  No problems updated.  ALLERGIES: No Known Allergies  PAST MEDICAL HISTORY: Past Medical History:  Diagnosis Date  . Back pain   . Diabetes mellitus ([place])   . Glaucoma   . Hyperlipidemia   . Menorrhagia 12/17/2011  . No pertinent past medical history     MEDICATIONS AT HOME: Prior to Admission medications   Medication Sig Start Date End Date Taking? Authorizing Provider  latanoprost (XALATAN) 0.005 % ophthalmic solution SMARTSIG:1 Drop(s) In Eye(s) Every Evening 05/16/20  Yes [provider]  Multiple Vitamin (MULTIVITAMIN WITH MINERALS) TABS tablet Take 1 tablet by mouth daily.   Yes [provider]  traMADol (ULTRAM) 50 MG tablet Take 1 tablet (50 mg total) by mouth 3 (three) times daily as needed. 12/30/20  Yes Dwana Melena L, PA-C  Travoprost, BAK Free, (TRAVATAN) 0.004 % SOLN ophthalmic solution Place 1 drop into both eyes at bedtime. Samples   Yes [provider]  atorvastatin (LIPITOR) 20 MG tablet Take 1  tablet (20 mg total) by mouth daily. 01/29/21   Argentina Donovan, PA-C  glucose blood (ONETOUCH VERIO) test strip Use to check blood sugar TID. E11.65 01/29/21   Argentina Donovan, PA-C  insulin glargine (LANTUS SOLOSTAR) 100 UNIT/ML Solostar Pen Inject 16 Units into the skin daily. 01/29/21   Argentina Donovan, PA-C  Insulin Pen Needle (TRUEPLUS 5-BEVEL PEN NEEDLES) 32G X 4 MM MISC Use to inject Basaglar once daily. E11.65 01/29/21   Argentina Donovan, PA-C  Lancets [facility] DELICA PLUS [id number]) MISC Use to check blood sugar TID. E11.65 01/29/21   Argentina Donovan, PA-C  metFORMIN (GLUCOPHAGE) 500 MG tablet Take 2 tablets (1,000 mg total) by mouth daily with breakfast. 01/29/21 02/28/21  Argentina Donovan, PA-C     Objective:  EXAM:   Vitals:   01/29/21 0924  BP: 122/77  Pulse: 77  Resp: 18  SpO2: 97%  Weight: 189 lb (85.7 kg)  Height: 5\' 8"  (1.727 m)    General appearance : A&OX3. NAD. Non-toxic-appearing HEENT: Atraumatic and Normocephalic.  PERRLA. EOM intact. Chest/Lungs:  Breathing-non-labored, Good air entry bilaterally, breath sounds normal without rales, rhonchi, or wheezing  CVS: S1 S2 regular, no murmurs, gallops, rubs  Extremities: Bilateral Lower Ext shows no edema, both legs are warm to touch with = pulse throughout Neurology:  CN II-XII grossly intact, Non focal.   Psych:  TP linear. J/I WNL. Normal speech. Appropriate eye contact  and affect.  Skin:  No Rash  Data Review Lab Results  Component Value Date   HGBA1C 7.4 (A) 01/29/2021   HGBA1C 5.6 07/23/2020   HGBA1C 5.4 03/26/2020     Assessment & Plan   1. Type 2 diabetes mellitus with hyperglycemia, with long-term current use of insulin (HCC) Uncontrolled but not taking basaglar for last 3 months until 2 weeks ago - Glucose (CBG) - HgB A1c - Comprehensive metabolic panel - CBC with Differential/Platelet - insulin glargine (LANTUS SOLOSTAR) 100 UNIT/ML Solostar Pen; Inject 16 Units into the skin daily.   Dispense: 15 mL; Refill: 5 - Insulin Pen Needle (TRUEPLUS 5-BEVEL PEN NEEDLES) 32G X 4 MM MISC; Use to inject Basaglar once daily. E11.65  Dispense: 100 each; Refill: 2 - glucose blood (ONETOUCH VERIO) test strip; Use to check blood sugar TID. E11.65  Dispense: 100 each; Refill: 12 - Lancets (ONETOUCH DELICA PLUS [id number]) MISC; Use to check blood sugar TID. E11.65  Dispense: 100 each; Refill: 0 - metFORMIN (GLUCOPHAGE) 500 MG tablet; Take 2 tablets (1,000 mg total) by mouth daily with breakfast.  Dispense: 60 tablet; Refill: 0  2. Hyperlipidemia, unspecified hyperlipidemia type - Lipid panel - atorvastatin (LIPITOR) 20 MG tablet; Take 1 tablet (20 mg total) by mouth daily.  Dispense: 90 tablet; Refill: 1     Patient have been counseled extensively about nutrition and exercise  Return in about 3 months (around 04/30/2021) for PCP for DM/hyperlipidemia.  The patient was given clear instructions to go to ER or return to medical center if symptoms don't improve, worsen or new problems develop. The patient verbalized understanding. The patient was told to call to get lab results if they haven't heard anything in the next week.     Freeman Caldron, PA-C [facility] and [facility] [place], [place]   01/29/2021, 9:35 AM

## 2021-01-30 LAB — LIPID PANEL
Chol/HDL Ratio: 4.8 ratio — ABNORMAL HIGH (ref 0.0–4.4)
Cholesterol, Total: 295 mg/dL — ABNORMAL HIGH (ref 100–199)
HDL: 62 mg/dL (ref >39)
LDL Chol Calc (NIH): 211 mg/dL — ABNORMAL HIGH (ref 0–99)
Triglycerides: 122 mg/dL (ref 0–149)
VLDL Cholesterol Cal: 22 mg/dL (ref 5–40)

## 2021-01-30 LAB — CBC WITH DIFFERENTIAL/PLATELET
Basophils Absolute: 0.1 10*3/uL (ref 0.0–0.2)
Basos: 1 %
EOS (ABSOLUTE): 0.2 10*3/uL (ref 0.0–0.4)
Eos: 3 %
Hematocrit: 36.6 % (ref 34.0–46.6)
Hemoglobin: 12 g/dL (ref 11.1–15.9)
Immature Grans (Abs): 0 10*3/uL (ref 0.0–0.1)
Immature Granulocytes: 0 %
Lymphocytes Absolute: 3.1 10*3/uL (ref 0.7–3.1)
Lymphs: 51 %
MCH: 28.4 pg (ref 26.6–33.0)
MCHC: 32.8 g/dL (ref 31.5–35.7)
MCV: 87 fL (ref 79–97)
Monocytes Absolute: 0.4 10*3/uL (ref 0.1–0.9)
Monocytes: 6 %
Neutrophils Absolute: 2.4 10*3/uL (ref 1.4–7.0)
Neutrophils: 39 %
Platelets: 342 10*3/uL (ref 150–450)
RBC: 4.23 x10E6/uL (ref 3.77–5.28)
RDW: 11.2 % — ABNORMAL LOW (ref 11.7–15.4)
WBC: 6.1 10*3/uL (ref 3.4–10.8)

## 2021-01-30 LAB — COMPREHENSIVE METABOLIC PANEL
ALT: 25 IU/L (ref 0–32)
AST: 15 IU/L (ref 0–40)
Albumin/Globulin Ratio: 1.6 (ref 1.2–2.2)
Albumin: 4.4 g/dL (ref 3.8–4.8)
Alkaline Phosphatase: 77 IU/L (ref 44–121)
BUN/Creatinine Ratio: 13 (ref 12–28)
BUN: 10 mg/dL (ref 8–27)
Bilirubin Total: 0.4 mg/dL (ref 0.0–1.2)
CO2: 22 mmol/L (ref 20–29)
Calcium: 9.8 mg/dL (ref 8.7–10.3)
Chloride: 105 mmol/L (ref 96–106)
Creatinine, Ser: 0.76 mg/dL (ref 0.57–1.00)
Globulin, Total: 2.8 g/dL (ref 1.5–4.5)
Glucose: 139 mg/dL — ABNORMAL HIGH (ref 65–99)
Potassium: 4.8 mmol/L (ref 3.5–5.2)
Sodium: 142 mmol/L (ref 134–144)
Total Protein: 7.2 g/dL (ref 6.0–8.5)
eGFR: 89 mL/min/{1.73_m2} (ref >59)

## 2021-02-16 ENCOUNTER — Encounter: Payer: Self-pay | Admitting: *Deleted

## 2021-02-18 ENCOUNTER — Ambulatory Visit: Payer: 59 | Admitting: Orthopaedic Surgery

## 2021-02-26 ENCOUNTER — Ambulatory Visit: Payer: Self-pay | Admitting: *Deleted

## 2021-02-26 ENCOUNTER — Telehealth: Payer: Self-pay | Admitting: Nurse Practitioner

## 2021-02-26 NOTE — Telephone Encounter (Signed)
Copied from [place] [phone]. Topic: General - Call Back - No Documentation >> Feb 26, 2021 12:59 PM Erick Blinks wrote: Pt called office per letter, she needs to discuss lab results (march) please advise  Best contact: [phone]

## 2021-02-26 NOTE — Telephone Encounter (Signed)
Pt has already received results.

## 2021-02-26 NOTE — Telephone Encounter (Signed)
Pt called in and was given her lab result message 02/04/2021 at 11:55 AM from [facility], PA-C.  No questions

## 2021-03-20 ENCOUNTER — Emergency Department (HOSPITAL_COMMUNITY): Payer: Medicaid Other

## 2021-03-20 ENCOUNTER — Other Ambulatory Visit: Payer: Self-pay

## 2021-03-20 ENCOUNTER — Emergency Department (HOSPITAL_COMMUNITY)
Admission: EM | Admit: 2021-03-20 | Discharge: 2021-03-20 | Disposition: A | Discharge status: Home / Self Care | Payer: Medicaid Other | Attending: Emergency Medicine | Admitting: Emergency Medicine

## 2021-03-20 ENCOUNTER — Encounter (HOSPITAL_COMMUNITY): Payer: Self-pay | Admitting: *Deleted

## 2021-03-20 DIAGNOSIS — R1030 Lower abdominal pain, unspecified: Secondary | ICD-10-CM | POA: Insufficient documentation

## 2021-03-20 DIAGNOSIS — Z794 Long term (current) use of insulin: Secondary | ICD-10-CM | POA: Insufficient documentation

## 2021-03-20 DIAGNOSIS — E1165 Type 2 diabetes mellitus with hyperglycemia: Secondary | ICD-10-CM | POA: Diagnosis not present

## 2021-03-20 DIAGNOSIS — Z7984 Long term (current) use of oral hypoglycemic drugs: Secondary | ICD-10-CM | POA: Diagnosis not present

## 2021-03-20 DIAGNOSIS — R1032 Left lower quadrant pain: Secondary | ICD-10-CM

## 2021-03-20 LAB — CBC
HCT: 39 % (ref 36.0–46.0)
Hemoglobin: 12.1 g/dL (ref 12.0–15.0)
MCH: 28.9 pg (ref 26.0–34.0)
MCHC: 31 g/dL (ref 30.0–36.0)
MCV: 93.1 fL (ref 80.0–100.0)
Platelets: 327 10*3/uL (ref 150–400)
RBC: 4.19 MIL/uL (ref 3.87–5.11)
RDW: 12 % (ref 11.5–15.5)
WBC: 5.7 10*3/uL (ref 4.0–10.5)
nRBC: 0 % (ref 0.0–0.2)

## 2021-03-20 LAB — URINALYSIS, ROUTINE W REFLEX MICROSCOPIC
Bilirubin Urine: NEGATIVE
Glucose, UA: NEGATIVE mg/dL
Hgb urine dipstick: NEGATIVE
Ketones, ur: NEGATIVE mg/dL
Leukocytes,Ua: NEGATIVE
Nitrite: NEGATIVE
Protein, ur: NEGATIVE mg/dL
Specific Gravity, Urine: 1.01 (ref 1.005–1.030)
pH: 6 (ref 5.0–8.0)

## 2021-03-20 LAB — COMPREHENSIVE METABOLIC PANEL
ALT: 20 U/L (ref 0–44)
AST: 18 U/L (ref 15–41)
Albumin: 3.7 g/dL (ref 3.5–5.0)
Alkaline Phosphatase: 55 U/L (ref 38–126)
Anion gap: 7 (ref 5–15)
BUN: 10 mg/dL (ref 8–23)
CO2: 22 mmol/L (ref 22–32)
Calcium: 9.1 mg/dL (ref 8.9–10.3)
Chloride: 109 mmol/L (ref 98–111)
Creatinine, Ser: 0.82 mg/dL (ref 0.44–1.00)
GFR, Estimated: >60 mL/min (ref >60)
Glucose, Bld: 138 mg/dL — ABNORMAL HIGH (ref 70–99)
Potassium: 3.7 mmol/L (ref 3.5–5.1)
Sodium: 138 mmol/L (ref 135–145)
Total Bilirubin: 0.5 mg/dL (ref 0.3–1.2)
Total Protein: 6.8 g/dL (ref 6.5–8.1)

## 2021-03-20 LAB — LIPASE, BLOOD: Lipase: 27 U/L (ref 11–51)

## 2021-03-20 MED ORDER — ACETAMINOPHEN 500 MG PO TABS
1000.0000 mg | ORAL_TABLET | Freq: Once | ORAL | Status: AC
  Administered 2021-03-20: 1000 mg via ORAL
  Filled 2021-03-20: qty 2

## 2021-03-20 NOTE — ED Triage Notes (Signed)
Pt c/o LLQ pain x 2 days.  Denies changes in bowel or bladder habits.  Denies nv.

## 2021-03-20 NOTE — ED Provider Notes (Signed)
[facility] EMERGENCY DEPARTMENT Provider Note   CSN: [id number] Arrival date & time: 03/20/21  0818     History Chief Complaint  Patient presents with  . Abdominal Pain    Roberta Jenkins is a 60 y.o. female.  Patient with diabetes, hyperlipidemia presents with left groin pain for 2 days.  No change in bowel or bladder habits.  No urinary symptoms.  No history of similar.  Patient feels it came on after lifting something heavy.  No history of hernia or bulging.  No weakness or numbness in her legs.  No fevers or chills.  No chest pain or shortness of breath.  Patient had C-section as surgical history.  Pain is constant.  No history of kidney stone.        Past Medical History:  Diagnosis Date  . Back pain   . Diabetes mellitus ([place])   . Glaucoma   . Hyperlipidemia   . Menorrhagia 12/17/2011  . No pertinent past medical history     Patient Active Problem List   Diagnosis Date Noted  . Type 2 diabetes mellitus with hyperglycemia ([place]) 12/19/2019  . Fibroid 09/04/2015  . Menorrhagia 12/17/2011    Past Surgical History:  Procedure Laterality Date  . [place]   2x  . MULTIPLE TOOTH EXTRACTIONS       OB History    Gravida  4   Para  3   Term  3   Preterm      AB  1   Living  3     SAB  1   IAB      Ectopic      Multiple      Live Births              Family History  Problem Relation Age of Onset  . Cancer Mother        colon  . Cancer Father   . Kidney disease Sister   . Hypertension Brother     Social History   Tobacco Use  . Smoking status: Never Smoker  . Smokeless tobacco: Never Used  Vaping Use  . Vaping Use: Never used  Substance Use Topics  . Alcohol use: No  . Drug use: No    Home Medications Prior to Admission medications   Medication Sig Start Date End Date Taking? Authorizing Provider  atorvastatin (LIPITOR) 20 MG tablet Take 1 tablet (20 mg total) by mouth daily. 01/29/21    Argentina Donovan, PA-C  glucose blood (ONETOUCH VERIO) test strip Use to check blood sugar TID. E11.65 01/29/21   Argentina Donovan, PA-C  insulin glargine (LANTUS SOLOSTAR) 100 UNIT/ML Solostar Pen Inject 16 Units into the skin daily. 01/29/21   Argentina Donovan, PA-C  Insulin Pen Needle (TRUEPLUS 5-BEVEL PEN NEEDLES) 32G X 4 MM MISC Use to inject Basaglar once daily. E11.65 01/29/21   Argentina Donovan, PA-C  Lancets [facility] DELICA PLUS [id number]) MISC Use to check blood sugar TID. E11.65 01/29/21   Argentina Donovan, PA-C  latanoprost (XALATAN) 0.005 % ophthalmic solution SMARTSIG:1 Drop(s) In Eye(s) Every Evening 05/16/20   [provider]  metFORMIN (GLUCOPHAGE) 500 MG tablet Take 2 tablets (1,000 mg total) by mouth daily with breakfast. 01/29/21 02/28/21  Argentina Donovan, PA-C  Multiple Vitamin (MULTIVITAMIN WITH MINERALS) TABS tablet Take 1 tablet by mouth daily.    [provider]  traMADol (ULTRAM) 50 MG tablet Take 1 tablet (50 mg  total) by mouth 3 (three) times daily as needed. 12/30/20   Aundra Dubin, PA-C  Travoprost, BAK Free, (TRAVATAN) 0.004 % SOLN ophthalmic solution Place 1 drop into both eyes at bedtime. Samples    [provider]    Allergies    Patient has no known allergies.  Review of Systems   Review of Systems  Constitutional: Negative for chills and fever.  HENT: Negative for congestion.   Eyes: Negative for visual disturbance.  Respiratory: Negative for shortness of breath.   Cardiovascular: Negative for chest pain.  Gastrointestinal: Positive for abdominal pain. Negative for vomiting.  Genitourinary: Negative for dysuria and flank pain.  Musculoskeletal: Negative for back pain, neck pain and neck stiffness.  Skin: Negative for rash.  Neurological: Negative for light-headedness and headaches.    Physical Exam Updated Vital Signs BP 133/88 (BP Location: Right Arm)   Pulse 63   Temp 97.9 F (36.6 C) (Oral)   Resp 16   Ht 5\' 8"   (1.727 m)   Wt 81.6 kg   LMP 08/09/2018 (Approximate)   SpO2 100%   BMI 27.37 kg/m   Physical Exam Vitals and nursing note reviewed.  Constitutional:      Appearance: She is well-developed.  HENT:     Head: Normocephalic and atraumatic.  Eyes:     General:        Right eye: No discharge.        Left eye: No discharge.     Conjunctiva/sclera: Conjunctivae normal.  Neck:     Trachea: No tracheal deviation.  Cardiovascular:     Rate and Rhythm: Normal rate and regular rhythm.  Pulmonary:     Effort: Pulmonary effort is normal.     Breath sounds: Normal breath sounds.  Abdominal:     General: There is no distension.     Palpations: Abdomen is soft.     Tenderness: There is abdominal tenderness (mild left groin, no hernia appreciated, or infection). There is no guarding.  Musculoskeletal:     Cervical back: Normal range of motion and neck supple.  Skin:    General: Skin is warm.     Findings: No rash.  Neurological:     Mental Status: She is alert and oriented to person, place, and time.     ED Results / Procedures / Treatments   Labs (all labs ordered are listed, but only abnormal results are displayed) Labs Reviewed  COMPREHENSIVE METABOLIC PANEL - Abnormal; Notable for the following components:      Result Value   Glucose, Bld 138 (*)    All other components within normal limits  LIPASE, BLOOD  CBC  URINALYSIS, ROUTINE W REFLEX MICROSCOPIC    EKG None  Radiology CT ABDOMEN PELVIS WO CONTRAST  Result Date: 03/20/2021 CLINICAL DATA:  60 year old female with left lower quadrant and left groin pain. EXAM: CT ABDOMEN AND PELVIS WITHOUT CONTRAST TECHNIQUE: Multidetector CT imaging of the abdomen and pelvis was performed following the standard protocol without IV contrast. COMPARISON:  CT Abdomen and Pelvis 04/26/2020. FINDINGS: Lower chest: Mild chronic right lower lobe scarring in the medial basal segment, azygoesophageal recess, appears stable since last year.  Otherwise negative. Hepatobiliary: Negative noncontrast liver and gallbladder. Pancreas: Negative. Spleen: Negative. Adrenals/Urinary Tract: Normal adrenal glands. Negative noncontrast kidneys. No nephrolithiasis or hydronephrosis. Proximal ureters appear within normal limits. Negative urinary bladder aside from mild mass effect due to fibroid uterus Stomach/Bowel: Redundant sigmoid colon which tracks into the left upper quadrant with superimposed redundant  transverse colon. Mild retained stool in the large bowel. No large bowel inflammation. Normal appendix on coronal image 25. Negative terminal ileum. No dilated small bowel. Decompressed stomach and duodenum. No free air, free fluid, mesenteric inflammation. Vascular/Lymphatic: Aortoiliac calcified atherosclerosis. Normal caliber abdominal aorta. Reproductive: Stable uterine enlargement, fibroid related on prior study with contrast. Other: No pelvic free fluid. Musculoskeletal: No acute osseous abnormality identified. Lower lumbar facet arthropathy. IMPRESSION: 1. No urinary calculus, acute or inflammatory process identified in the non-contrast abdomen or pelvis. Mid 2. Fibroid uterus. 3. Aortic Atherosclerosis (ICD10-I70.0). Electronically Signed   By: Genevie Ann M.D.   On: 03/20/2021 10:20    Procedures Procedures   Medications Ordered in ED Medications  acetaminophen (TYLENOL) tablet 1,000 mg (1,000 mg Oral Given 03/20/21 1022)    ED Course  I have reviewed the triage vital signs and the nursing notes.  Pertinent labs & imaging results that were available during my care of the patient were reviewed by me and considered in my medical decision making (see chart for details).    MDM Rules/Calculators/A&P                          Patient presents with left groin pain that had association with lifting.  Concern clinically for musculoskeletal/muscle strain/small hernia versus kidney stone versus other.  With pain constant not specifically with  movement plan for CT scan for further delineation urinalysis.  Tylenol for pain.  Vital signs reviewed normal. Patient's blood work ordered and reviewed showing normal lipase no signs of pancreatitis, normal liver function, electrolytes hemoglobin and kidney function all unremarkable reviewed.  Urinalysis no sign of infection.  CT scan no obvious hernia, no kidney stone.  Patient stable for outpatient follow-up.   Final Clinical Impression(s) / ED Diagnoses Final diagnoses:  Left groin pain    Rx / DC Orders ED Discharge Orders    None       Elnora Morrison, MD 03/20/21 1144

## 2021-03-20 NOTE — Discharge Instructions (Addendum)
Use Tylenol every 4 hours and ibuprofen every 6 hours as needed and tolerated for pain. See a local physician if no improvement by Monday.  Avoid lifting anything heavy over the weekend.

## 2021-03-20 NOTE — ED Notes (Signed)
Patient transported to CT 

## 2021-03-31 ENCOUNTER — Ambulatory Visit: Payer: 59 | Admitting: Orthopaedic Surgery

## 2021-04-08 ENCOUNTER — Other Ambulatory Visit: Payer: Self-pay | Admitting: Nurse Practitioner

## 2021-04-08 DIAGNOSIS — Z1231 Encounter for screening mammogram for malignant neoplasm of breast: Secondary | ICD-10-CM

## 2021-04-15 ENCOUNTER — Ambulatory Visit (INDEPENDENT_AMBULATORY_CARE_PROVIDER_SITE_OTHER): Payer: Medicaid Other | Admitting: Orthopaedic Surgery

## 2021-04-15 ENCOUNTER — Encounter: Payer: Self-pay | Admitting: Orthopaedic Surgery

## 2021-04-15 DIAGNOSIS — M25561 Pain in right knee: Secondary | ICD-10-CM

## 2021-04-15 DIAGNOSIS — G8929 Other chronic pain: Secondary | ICD-10-CM

## 2021-04-15 MED ORDER — METHYLPREDNISOLONE ACETATE 40 MG/ML IJ SUSP
40.0000 mg | INTRAMUSCULAR | Status: AC | PRN
  Administered 2021-04-15: 40 mg via INTRA_ARTICULAR

## 2021-04-15 MED ORDER — BUPIVACAINE HCL 0.25 % IJ SOLN
2.0000 mL | INTRAMUSCULAR | Status: AC | PRN
  Administered 2021-04-15: 2 mL via INTRA_ARTICULAR

## 2021-04-15 MED ORDER — LIDOCAINE HCL 1 % IJ SOLN
2.0000 mL | INTRAMUSCULAR | Status: AC | PRN
  Administered 2021-04-15: 2 mL

## 2021-04-15 NOTE — Progress Notes (Signed)
Office Visit Note   Patient: Roberta Jenkins           Date of Birth: Feb 03, 1961           MRN: [id number] Visit Date: 04/15/2021              Requested by: Gildardo Pounds, NP [place],  [place] [address] PCP: Gildardo Pounds, NP   Assessment & Plan: Visit Diagnoses:  1. Chronic pain of right knee     Plan: Impression is chronic right knee pain with underlying DJD and degenerative lateral meniscus tear.   I believe the patient symptoms are coming from her underlying OA.  We have discussed cortisone injection for which she would like to proceed.  She tolerated this well today.  She will follow-up with [place] as needed. Follow-Up Instructions: Return if symptoms worsen or fail to improve.   Orders:  Orders Placed This Encounter  Procedures   Large Joint Inj: R knee    No orders of the defined types were placed in this encounter.     Procedures: Large Joint Inj: R knee on 04/15/2021 11:08 AM Indications: pain Details: 22 G needle, anterolateral approach Medications: 2 mL lidocaine 1 %; 2 mL bupivacaine 0.25 %; 40 mg methylPREDNISolone acetate 40 MG/ML     Clinical Data: No additional findings.   Subjective: Chief Complaint  Patient presents with   Right Knee - Pain, Follow-up     HPI patient is a pleasant 60 year old female who comes in today with recurrent right knee pain.  She does have a history of underlying degenerative joint disease with degenerative lateral meniscus tear seen on MRI from last year.  We have been seeing her intermittently for right knee pain since.  She was seen in our office approximately 3 months ago where her symptoms were not bad enough for cortisone injection.  She comes in today with worsening pain.  The pain she has is primarily to the anteromedial aspect.  She notes occasional locking catching in addition to popping sensations.  Stairs and squatting seem to worsen her symptoms.  She has been wearing a brace and taking  tramadol with mild relief of symptoms.  Review of Systems as detailed in HPI.  All others reviewed and are negative.   Objective: Vital Signs: LMP 08/09/2018 (Approximate)   Physical Exam well-developed well-nourished female in no acute distress.  Alert and oriented x3.  Ortho Exam right knee exam shows a trace effusion.  Range of motion 0 to 90 degrees.  She does have moderate tenderness to the anteromedial joint line.  Ligaments are stable.  She is neurovascular intact distally.  Specialty Comments:  No specialty comments available.  Imaging: No new imaging   PMFS History: Patient Active Problem List   Diagnosis Date Noted   Type 2 diabetes mellitus with hyperglycemia ([place]) 12/19/2019   Fibroid 09/04/2015   Menorrhagia 12/17/2011   Past Medical History:  Diagnosis Date   Back pain    Diabetes mellitus ([place])    Glaucoma    Hyperlipidemia    Menorrhagia 12/17/2011   No pertinent past medical history     Family History  Problem Relation Age of Onset   Cancer Mother        colon   Cancer Father    Kidney disease Sister    Hypertension Brother     Past Surgical History:  Procedure Laterality Date   [place]   2x  MULTIPLE TOOTH EXTRACTIONS     Social History   Occupational History   Not on file  Tobacco Use   Smoking status: Never   Smokeless tobacco: Never  Vaping Use   Vaping Use: Never used  Substance and Sexual Activity   Alcohol use: No   Drug use: No   Sexual activity: Never    Birth control/protection: Abstinence    Comment: last intercourse was 5 yrs ago

## 2021-05-01 ENCOUNTER — Telehealth: Payer: Self-pay

## 2021-05-01 ENCOUNTER — Ambulatory Visit: Payer: Medicaid Other | Attending: Nurse Practitioner | Admitting: Nurse Practitioner

## 2021-05-01 ENCOUNTER — Other Ambulatory Visit: Payer: Self-pay

## 2021-05-01 VITALS — BP 120/74 | HR 67 | Ht 68.0 in | Wt 188.2 lb

## 2021-05-01 DIAGNOSIS — F32A Depression, unspecified: Secondary | ICD-10-CM | POA: Diagnosis not present

## 2021-05-01 DIAGNOSIS — Z79899 Other long term (current) drug therapy: Secondary | ICD-10-CM | POA: Insufficient documentation

## 2021-05-01 DIAGNOSIS — Z794 Long term (current) use of insulin: Secondary | ICD-10-CM | POA: Diagnosis not present

## 2021-05-01 DIAGNOSIS — M25561 Pain in right knee: Secondary | ICD-10-CM

## 2021-05-01 DIAGNOSIS — G8929 Other chronic pain: Secondary | ICD-10-CM | POA: Diagnosis not present

## 2021-05-01 DIAGNOSIS — H409 Unspecified glaucoma: Secondary | ICD-10-CM | POA: Diagnosis not present

## 2021-05-01 DIAGNOSIS — E785 Hyperlipidemia, unspecified: Secondary | ICD-10-CM | POA: Insufficient documentation

## 2021-05-01 DIAGNOSIS — E1165 Type 2 diabetes mellitus with hyperglycemia: Secondary | ICD-10-CM | POA: Diagnosis not present

## 2021-05-01 DIAGNOSIS — F419 Anxiety disorder, unspecified: Secondary | ICD-10-CM

## 2021-05-01 DIAGNOSIS — Z1211 Encounter for screening for malignant neoplasm of colon: Secondary | ICD-10-CM

## 2021-05-01 LAB — POCT GLYCOSYLATED HEMOGLOBIN (HGB A1C): HbA1c, POC (controlled diabetic range): 6.4 % (ref 0.0–7.0)

## 2021-05-01 LAB — GLUCOSE, POCT (MANUAL RESULT ENTRY): POC Glucose: 152 mg/dl — AB (ref 70–99)

## 2021-05-01 MED ORDER — ATORVASTATIN CALCIUM 20 MG PO TABS: 20.0000 mg | ORAL_TABLET | Freq: Every day | ORAL | 1 refills | Status: DC

## 2021-05-01 MED ORDER — ACCU-CHEK GUIDE ME W/DEVICE KIT: 1.0000 | PACK | Freq: Once | 0 refills | Status: AC

## 2021-05-01 MED ORDER — ACCU-CHEK GUIDE VI STRP
ORAL_STRIP | 12 refills | Status: DC
Start: 2021-05-01 — End: 2022-04-13

## 2021-05-01 MED ORDER — LANTUS SOLOSTAR 100 UNIT/ML ~~LOC~~ SOPN: 15.0000 [IU] | PEN_INJECTOR | Freq: Every day | SUBCUTANEOUS | 1 refills | Status: DC

## 2021-05-01 MED ORDER — METFORMIN HCL 500 MG PO TABS: 1000.0000 mg | ORAL_TABLET | Freq: Every day | ORAL | 0 refills | Status: DC

## 2021-05-01 MED ORDER — METFORMIN HCL 500 MG PO TABS
1000.0000 mg | ORAL_TABLET | Freq: Every day | ORAL | 1 refills | Status: DC
Start: 2021-05-01 — End: 2021-12-04

## 2021-05-01 MED ORDER — TRAMADOL HCL 50 MG PO TABS: 50.0000 mg | ORAL_TABLET | Freq: Two times a day (BID) | ORAL | 0 refills | Status: DC | PRN

## 2021-05-01 MED ORDER — TRUEPLUS 5-BEVEL PEN NEEDLES 32G X 4 MM MISC: 2 refills | Status: DC

## 2021-05-01 MED ORDER — ACCU-CHEK SOFTCLIX LANCETS MISC: 3 refills | Status: DC

## 2021-05-01 NOTE — Telephone Encounter (Signed)
Left L4-5 and L5-S1 facets on 12/25/20. Ok to repeat if helped, same problem/side, and no new injury?

## 2021-05-01 NOTE — Telephone Encounter (Signed)
Pt came into the office wanted to make an appt with Dr.Newton please advise

## 2021-05-01 NOTE — Progress Notes (Signed)
Assessment & Plan:  [name] was seen today for diabetes.  Diagnoses and all orders for this visit:  Type 2 diabetes mellitus with hyperglycemia, with long-term current use of insulin (HCC) -     POCT glucose (manual entry) -     POCT glycosylated hemoglobin (Hb A1C) -     insulin glargine (LANTUS SOLOSTAR) 100 UNIT/ML Solostar Pen; Inject 15 Units into the skin daily. -     Discontinue: metFORMIN (GLUCOPHAGE) 500 MG tablet; Take 2 tablets (1,000 mg total) by mouth daily with breakfast. -     Insulin Pen Needle (TRUEPLUS 5-BEVEL PEN NEEDLES) 32G X 4 MM MISC; Use to inject Basaglar once daily. E11.65 -     Accu-Chek Softclix Lancets lancets; Use as instructed. Inject into the skin twice daily -     glucose blood (ACCU-CHEK GUIDE) test strip; Use as instructed. Check blood glucose by fingerstick twice per day. -     Blood Glucose Monitoring Suppl (ACCU-CHEK GUIDE ME) w/Device KIT; 1 each by Does not apply route once for 1 dose. -     metFORMIN (GLUCOPHAGE) 500 MG tablet; Take 2 tablets (1,000 mg total) by mouth daily with breakfast. Continue blood sugar control as discussed in office today, low carbohydrate diet, and regular physical exercise as tolerated, 150 minutes per week (30 min each day, 5 days per week, or 50 min 3 days per week). Keep blood sugar logs with fasting goal of 90-130 mg/dl, post prandial (after you eat) less than 180.  For Hypoglycemia: BS <60 and Hyperglycemia BS >400; contact the clinic ASAP. Annual eye exams and foot exams are recommended.   Hyperlipidemia, unspecified hyperlipidemia type -     atorvastatin (LIPITOR) 20 MG tablet; Take 1 tablet (20 mg total) by mouth daily. INSTRUCTIONS: Work on a low fat, heart healthy diet and participate in regular aerobic exercise program by working out at least 150 minutes per week; 5 days a week-30 minutes per day. Avoid red meat/beef/steak,  fried foods. junk foods, sodas, sugary drinks, unhealthy snacking, alcohol and smoking.   Drink at least 80 oz of water per day and monitor your carbohydrate intake daily.    Chronic pain of right knee -     traMADol (ULTRAM) 50 MG tablet; Take 1 tablet (50 mg total) by mouth every 12 (twelve) hours as needed. Work on losing weight to help reduce joint pain. May alternate with heat and ice application for pain relief. May also alternate with acetaminophen and Ibuprofen as prescribed pain relief. Other alternatives include massage, acupuncture and water aerobics.  You must stay active and avoid a sedentary lifestyle.   Colon cancer screening -     Ambulatory referral to Gastroenterology  Anxiety and depression -     Ambulatory referral to Psychiatry   Patient has been counseled on age-appropriate routine health concerns for screening and prevention. These are reviewed and up-to-date. Referrals have been placed accordingly. Immunizations are up-to-date or declined.    Subjective:   Chief Complaint  Patient presents with   Diabetes   HPI Roberta Jenkins 60 y.o. female presents to office today for follow up  has a past medical history of Back pain, Diabetes mellitus ([place]), Glaucoma, Hyperlipidemia, Menorrhagia (12/17/2011), and No pertinent past medical history.   Endorses a lot of stress and anxiety. She would like to be referred to a psychiatrist/therapist.  Declines SSRI today.  Depression screen [facility] 2/9 05/01/2021 01/29/2021 04/22/2020 12/19/2019  Decreased Interest 2 2 0 0  Down,  Depressed, Hopeless 2 2 0 2  PHQ - 2 Score 4 4 0 2  Altered sleeping 2 2 0 2  Tired, decreased energy 2 1 0 3  Change in appetite 3 3 0 3  Feeling bad or failure about yourself  2 1 0 3  Trouble concentrating 2 1 0 2  Moving slowly or fidgety/restless 2 2 0 3  Suicidal thoughts 2 0 0 1  PHQ-9 Score 19 14 0 19   GAD 7 : Generalized Anxiety Score 05/01/2021 01/29/2021 04/22/2020 12/19/2019  Nervous, Anxious, on Edge _0 Control/stop worrying _1 Worry too much - different things _2 Trouble relaxing 2 1 0 3  Restless 2 2 0 3  Easily annoyed or irritable _3 Afraid - awful might happen 2 2 0 3  Total GAD 7 Score _4 Anxiety Difficulty - Very difficult - -        DM 2 Well controlled with lantus  which we are decreasing to 15 units daily and she will continue on metformin 1000 mg Daily. Cholesterol levels elevated and poorly controlled . She endorses adherence taking atorvastatin 20 mg daily.  Lab Results  Component Value Date   HGBA1C 6.4 05/01/2021    Lab Results  Component Value Date   LDLCALC 211 (H) 01/29/2021     Chronic right knee pain She needs to follow up with her orthopedist. Per last office visit notes on 04-15-2021. 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   I believe the patient symptoms are coming from her underlying OA.  We have discussed cortisone injection for which she would like to proceed.  She tolerated this well today.  She will follow-up with [place] as needed. Follow-Up Instructions: Return if symptoms worsen or fail to improve.      Review of Systems  Constitutional:  Negative for fever, malaise/fatigue and weight loss.  HENT: Negative.  Negative for nosebleeds.   Eyes: Negative.  Negative for blurred vision, double vision and photophobia.  Respiratory: Negative.  Negative for cough and shortness of breath.   Cardiovascular: Negative.  Negative for chest pain, palpitations and leg swelling.  Gastrointestinal: Negative.  Negative for heartburn, nausea and vomiting.  Musculoskeletal:  Positive for joint pain. Negative for myalgias.  Neurological: Negative.  Negative for dizziness, focal weakness, seizures and headaches.  Psychiatric/Behavioral:  Positive for depression. Negative for suicidal ideas. The patient is nervous/anxious.    Past Medical History:  Diagnosis Date   Back pain    Diabetes mellitus ([place])    Glaucoma    Hyperlipidemia    Menorrhagia 12/17/2011   No  pertinent past medical history     Past Surgical History:  Procedure Laterality Date   CESAREAN SECTION  1981, 1989   2x   MULTIPLE TOOTH EXTRACTIONS      Family History  Problem Relation Age of Onset   Cancer Mother        colon   Cancer Father    Kidney disease Sister    Hypertension Brother     Social History Reviewed with no changes to be made today.   Outpatient Medications Prior to Visit  Medication Sig Dispense Refill   latanoprost (XALATAN) 0.005 % ophthalmic solution SMARTSIG:1 Drop(s) In Eye(s) Every Evening     Multiple Vitamin (MULTIVITAMIN WITH MINERALS) TABS tablet Take 1 tablet by mouth daily.  Travoprost, BAK Free, (TRAVATAN) 0.004 % SOLN ophthalmic solution Place 1 drop into both eyes at bedtime. Samples     atorvastatin (LIPITOR) 20 MG tablet Take 1 tablet (20 mg total) by mouth daily. 90 tablet 1   glucose blood (ONETOUCH VERIO) test strip Use to check blood sugar TID. E11.65 100 each 12   insulin glargine (LANTUS SOLOSTAR) 100 UNIT/ML Solostar Pen Inject 16 Units into the skin daily. 15 mL 5   Insulin Pen Needle (TRUEPLUS 5-BEVEL PEN NEEDLES) 32G X 4 MM MISC Use to inject Basaglar once daily. E11.65 100 each 2   Lancets (ONETOUCH DELICA PLUS MCNOBS96G) MISC Use to check blood sugar TID. E11.65 100 each 0   traMADol (ULTRAM) 50 MG tablet Take 1 tablet (50 mg total) by mouth 3 (three) times daily as needed. 40 tablet 0   metFORMIN (GLUCOPHAGE) 500 MG tablet Take 2 tablets (1,000 mg total) by mouth daily with breakfast. 60 tablet 0   No facility-administered medications prior to visit.    No Known Allergies     Objective:    BP 120/74   Pulse 67   Ht _0  (1.727 m)   Wt 188 lb 3.2 oz (85.4 kg)   LMP 08/09/2018 (Approximate)   SpO2 99%   BMI 28.62 kg/m  Wt Readings from Last 3 Encounters:  05/01/21 188 lb 3.2 oz (85.4 kg)  03/20/21 180 lb (81.6 kg)  01/29/21 189 lb (85.7 kg)    Physical Exam Vitals and nursing note reviewed.   Constitutional:      Appearance: She is well-developed.  HENT:     Head: Normocephalic and atraumatic.  Cardiovascular:     Rate and Rhythm: Normal rate and regular rhythm.     Heart sounds: Normal heart sounds. No murmur heard.   No friction rub. No gallop.  Pulmonary:     Effort: Pulmonary effort is normal. No tachypnea or respiratory distress.     Breath sounds: Normal breath sounds. No decreased breath sounds, wheezing, rhonchi or rales.  Chest:     Chest wall: No tenderness.  Abdominal:     General: Bowel sounds are normal.     Palpations: Abdomen is soft.  Musculoskeletal:        General: Normal range of motion.     Cervical back: Normal range of motion.     Right knee: Swelling present.  Skin:    General: Skin is warm and dry.  Neurological:     Mental Status: She is alert and oriented to person, place, and time.     Coordination: Coordination normal.  Psychiatric:        Behavior: Behavior normal. Behavior is cooperative.        Thought Content: Thought content normal.        Judgment: Judgment normal.         Patient has been counseled extensively about nutrition and exercise as well as the importance of adherence with medications and regular follow-up. The patient was given clear instructions to go to ER or return to medical center if symptoms don't improve, worsen or new problems develop. The patient verbalized understanding.   Follow-up: Return in about 3 months (around 08/01/2021).   Gildardo Pounds, FNP-BC [facility] [place], [place]   05/03/2021, 10:06 PM

## 2021-05-01 NOTE — Telephone Encounter (Signed)
Patient states that she had less than 50% relief from the last injections. I schedule her for an office visit with you on 7/12.

## 2021-05-03 ENCOUNTER — Encounter: Payer: Self-pay | Admitting: Nurse Practitioner

## 2021-05-12 ENCOUNTER — Encounter: Payer: Self-pay | Admitting: Physical Medicine and Rehabilitation

## 2021-05-12 ENCOUNTER — Other Ambulatory Visit: Payer: Self-pay

## 2021-05-12 ENCOUNTER — Ambulatory Visit (INDEPENDENT_AMBULATORY_CARE_PROVIDER_SITE_OTHER): Payer: Medicaid Other | Admitting: Physical Medicine and Rehabilitation

## 2021-05-12 VITALS — BP 116/76 | HR 71

## 2021-05-12 DIAGNOSIS — M545 Low back pain, unspecified: Secondary | ICD-10-CM

## 2021-05-12 DIAGNOSIS — M47816 Spondylosis without myelopathy or radiculopathy, lumbar region: Secondary | ICD-10-CM

## 2021-05-12 DIAGNOSIS — G8929 Other chronic pain: Secondary | ICD-10-CM | POA: Diagnosis not present

## 2021-05-12 DIAGNOSIS — M47819 Spondylosis without myelopathy or radiculopathy, site unspecified: Secondary | ICD-10-CM | POA: Diagnosis not present

## 2021-05-12 NOTE — Progress Notes (Signed)
Roberta Jenkins - 60 y.o. female MRN [id number]  Date of birth: 1961-04-16  Office Visit Note: Visit Date: 05/12/2021 PCP: Gildardo Pounds, NP Referred by: Gildardo Pounds, NP  Subjective: Chief Complaint  Patient presents with   Lower Back - Pain   HPI: Roberta Jenkins is a 60 y.o. female who comes in today for evaluation of chronic, worsening, and severe left-sided lower back pain.  Patient reports pain has been constant for several months. Patient describes pain as soreness that increases with lifting, bending over and prolonged standing. Patient rates pain 8 out of 10 at this time.  Patient reports some relief of pain when lying flat, sitting and use of Tramadol as needed. Patient had left L4-L5 intra-articular facet injections in February 2022 with 40 to 50% pain relief for 1 day.  She is somewhat of a poor historian but we really talked to her seems like for a little while it did seem to help her and then the pain just quickly returned.  Patient continues to see Dr. Frankey Shown for management of right knee osteoarthritis.  Patient previously attended physical therapy at [place] in 2021 for right knee osteoarthritis and verbalizes that she would like to go to physical therapy for her left sided lower back pain.  Patient's lumbar MRI from 2021 exhibits prominent facet degenerative changes at L4-L5 and L5-S1.  Patient denies any focal weakness, numbness, and tingling.  Patient denies any recent trauma or falls.   Review of Systems  Musculoskeletal:  Positive for back pain and joint pain.  Neurological:  Negative for tingling and focal weakness.  All other systems reviewed and are negative. Otherwise per HPI.  Assessment & Plan: Visit Diagnoses:    ICD-10-CM   1. Chronic left-sided low back pain without sciatica  M54.50 Ambulatory referral to Physical Therapy   G89.29     2. Facet arthropathy  M47.819 Ambulatory referral to Physical Therapy    3.  Spondylosis without myelopathy or radiculopathy, lumbar region  M47.816 Ambulatory referral to Physical Therapy       Plan: Findings:  Chronic, recalcitrant and worsening, and severe left lower axial back pain.  Imaging and exam findings are consistent with facet mediated low back pain and mechanical pain in general.  Patient continues to have pain with facet loading.  Plan of care discussed with patient in detail today, patient verbalizes that she would like to try physical therapy for her left lower back pain. Referral placed for [facility]. MRI reviewed in office today, pt is agreeable with plan of care.  If she does not get much relief with the physical therapy then the next step is medial branch nerve blocks with possible RFA discussed with patient as well and will follow-up for re-evaluation after physical therapy is complete.    Meds & Orders: No orders of the defined types were placed in this encounter.   Orders Placed This Encounter  Procedures   Ambulatory referral to Physical Therapy    Follow-up: Return if symptoms worsen or fail to improve.   Procedures: No procedures performed      Clinical History: MRI LUMBAR SPINE WITHOUT CONTRAST   TECHNIQUE: Multiplanar, multisequence MR imaging of the lumbar spine was performed. No intravenous contrast was administered.   COMPARISON:  Plain films July 28, 2020.   FINDINGS: Segmentation:  Standard.   Alignment: Trace anterolisthesis of L4 over L5. Mild levoconvex scoliosis of the lumbar spine.  Vertebrae:  No fracture, evidence of discitis, or bone lesion.   Conus medullaris and cauda equina: Conus extends to the L2 level. Conus and cauda equina appear normal.   Paraspinal and other soft tissues: Negative.   Disc levels:   T12-L1: No spinal canal or neural foraminal stenosis.   L1-2: No spinal canal or neural foraminal stenosis.   L2-3: No spinal canal or neural foraminal stenosis.   L3-4:  Left asymmetric disc bulge and mild facet degenerative changes resulting in mild narrowing of the left neural foramen. No significant spinal canal stenosis.   L4-5: Disc bulge/disc uncovering and prominent facet degenerative changes with ligamentum flavum redundancy without significant spinal canal or neural foraminal stenosis.   L5-S1: Shallow disc bulge prominent facet degenerative changes, right greater than left on a without significant spinal canal or neural foraminal stenosis.   IMPRESSION: 1. Prominent facet degenerative changes at L4-L5 and L5-S1. 2. Mild left neural foraminal narrowing at L3-L4. 3. No high-grade spinal canal or neural foraminal stenosis at any level.     Electronically Signed   By: Pedro Earls M.D.   On: 10/03/2020 10:19   She reports that she has never smoked. She has never used smokeless tobacco.  Recent Labs    07/23/20 1130 01/29/21 0932 05/01/21 0926  HGBA1C 5.6 7.4* 6.4    Objective:  VS:  HT:    WT:   BMI:     BP:116/76  HR:71bpm  TEMP: ( )  RESP:  Physical Exam Constitutional:      Appearance: Normal appearance.  HENT:     Head: Normocephalic and atraumatic.     Right Ear: Tympanic membrane normal.     Left Ear: Tympanic membrane normal.     Nose: Nose normal.     Mouth/Throat:     Mouth: Mucous membranes are moist.  Eyes:     Pupils: Pupils are equal, round, and reactive to light.  Cardiovascular:     Rate and Rhythm: Normal rate.  Pulmonary:     Effort: Pulmonary effort is normal.  Abdominal:     General: There is no distension.  Musculoskeletal:     Cervical back: Normal range of motion and neck supple.     Comments: Pt is slow to rise from seated position to standing. Pain noted with facet loading. Strong distal strength without clonus, no pain upon palpation of greater trochanters. Sensation intact bilaterally. Walks independently, gait steady.     Skin:    General: Skin is warm and dry.      Capillary Refill: Capillary refill takes less than 2 seconds.  Neurological:     General: No focal deficit present.     Mental Status: She is alert and oriented to person, place, and time.     Gait: Gait normal.  Psychiatric:        Mood and Affect: Mood normal.    Ortho Exam  Imaging: No results found.  Past Medical/Family/Surgical/Social History: Medications & Allergies reviewed per EMR, new medications updated. Patient Active Problem List   Diagnosis Date Noted   Type 2 diabetes mellitus with hyperglycemia ([place]) 12/19/2019   Fibroid 09/04/2015   Menorrhagia 12/17/2011   Past Medical History:  Diagnosis Date   Back pain    Diabetes mellitus ([place])    Glaucoma    Hyperlipidemia    Menorrhagia 12/17/2011   No pertinent past medical history    Family History  Problem Relation Age of Onset   Cancer Mother  colon   Cancer Father    Kidney disease Sister    Hypertension Brother    Past Surgical History:  Procedure Laterality Date   CESAREAN SECTION  1981, 1989   2x   MULTIPLE TOOTH EXTRACTIONS     Social History   Occupational History   Not on file  Tobacco Use   Smoking status: Never   Smokeless tobacco: Never  Vaping Use   Vaping Use: Never used  Substance and Sexual Activity   Alcohol use: No   Drug use: No   Sexual activity: Never    Birth control/protection: Abstinence    Comment: last intercourse was 5 yrs ago

## 2021-05-12 NOTE — Progress Notes (Signed)
Left sided low back pain. Left L4-5 and L5-S1 facet injections on 12/25/2020. Patient states that she had about 40% relief of her pain following the injection. Numeric Pain Rating Scale and Functional Assessment Average Pain 8 Pain Right Now 8 My pain is constant, dull, and aching Pain is worse with:  lifting Pain improves with:  rest   In the last MONTH (on 0-10 scale) has pain interfered with the following?  1. General activity like being  able to carry out your everyday physical activities such as walking, climbing stairs, carrying groceries, or moving a chair?  Rating(9)  2. Relation with others like being able to carry out your usual social activities and roles such as  activities at home, at work and in your community. Rating(9)  3. Enjoyment of life such that you have  been bothered by emotional problems such as feeling anxious, depressed or irritable?  Rating(10)

## 2021-05-13 NOTE — Progress Notes (Signed)
Patient was seen with Dr. Ernestina Patches, please see his note.

## 2021-05-25 ENCOUNTER — Ambulatory Visit: Payer: Medicaid Other | Attending: Physical Medicine and Rehabilitation

## 2021-05-25 ENCOUNTER — Other Ambulatory Visit: Payer: Self-pay

## 2021-05-25 DIAGNOSIS — M545 Low back pain, unspecified: Secondary | ICD-10-CM | POA: Diagnosis not present

## 2021-05-25 DIAGNOSIS — G8929 Other chronic pain: Secondary | ICD-10-CM | POA: Diagnosis present

## 2021-05-25 DIAGNOSIS — M6281 Muscle weakness (generalized): Secondary | ICD-10-CM | POA: Diagnosis present

## 2021-05-25 DIAGNOSIS — R2689 Other abnormalities of gait and mobility: Secondary | ICD-10-CM | POA: Diagnosis present

## 2021-05-25 DIAGNOSIS — R252 Cramp and spasm: Secondary | ICD-10-CM | POA: Diagnosis present

## 2021-05-25 NOTE — Therapy (Signed)
[place], [place], [address] Phone: [phone]   Fax:  [phone]  Physical Therapy Evaluation  Patient Details  Name: Janielle Schug MRN: [id number] Date of Birth: 05-23-1961 Referring Provider (PT): Lorine Bears, NP   Encounter Date: 05/25/2021   PT End of Session - 05/25/21 0908     Visit Number 1    Number of Visits 13    Date for PT Re-Evaluation 07/20/21    Authorization Type  MCD - CCME Auth needed    PT Start Time 0825    PT Stop Time 0905    PT Time Calculation (min) 40 min    Activity Tolerance Patient tolerated treatment well;No increased pain    Behavior During Therapy [facility] for tasks assessed/performed             Past Medical History:  Diagnosis Date   Back pain    Diabetes mellitus ([place])    Glaucoma    Hyperlipidemia    Menorrhagia 12/17/2011   No pertinent past medical history     Past Surgical History:  Procedure Laterality Date   [place]   2x   MULTIPLE TOOTH EXTRACTIONS      There were no vitals filed for this visit.    Subjective Assessment - 05/25/21 0826     Subjective Pt presents to PT with reports of L sided LBP for approximately one year with gradual onset, no MOI. Pt notes most pain with lifting and bending tasks. Denies b/b changes or saddle anesthesia. Also has chronic R knee pain due to meniscus tear.    Limitations Lifting;Standing;Walking    How long can you sit comfortably? 60 minutes    How long can you stand comfortably? 20 minutes    How long can you walk comfortably? 35 minutes    Patient Stated Goals Pt wants to decreasing LBP in order to improve comfort and function    Currently in Pain? 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  No    Is the patient reluctant to leave their home because of a fear of falling?  No      Home Ecologist residence  Type of Home Apartment    Additional Comments 20 steps into her apartment,.      Prior Function   Level of Independence Independent;Independent with basic ADLs    Vocation Unemployed      Cognition   Overall Cognitive Status Within Functional Limits for tasks assessed    Attention Focused      Observation/Other Assessments   Focus on Therapeutic Outcomes (FOTO)  No FOTO; MCD    Other Surveys  Oswestry Disability Index    Oswestry Disability Index  52% disability      Sensation   Light Touch Appears Intact      AROM   Overall AROM Comments decreased lumbar flex d/t pain      Strength   Right Hip Flexion 4/5    Right Hip ABduction 3+/5    Right Hip ADduction 3+/5    Left Hip Flexion 3+/5   pain   Left Hip ABduction 3+/5    Left Hip ADduction 3+/5    Right Knee Flexion 4/5    Right Knee Extension 4/5    Left Knee Flexion 4/5    Left Knee Extension 4/5    Right Ankle Dorsiflexion 5/5    Right Ankle Plantar Flexion 5/5    Left Ankle Dorsiflexion 5/5    Left  Ankle Plantar Flexion 5/5      Palpation   Palpation comment hypertonic and painful L sided lumbar paraspinals      Special Tests   Other special tests SLR and Slump tests neg bilaterally      Transfers   Five time sit to stand comments  37 seconds      Ambulation/Gait   Gait Comments antalgic gait towards R with decreased R knee flex noted                        Objective measurements completed on examination: See above findings.       [facility] Adult PT Treatment/Exercise - 05/25/21 0001       Exercises   Exercises Lumbar      Lumbar Exercises: Stretches   Lower Trunk Rotation Limitations x 10      Lumbar Exercises: Supine   Pelvic Tilt Limitations x 10 - difficult    Clam Limitations x 10 green tband    Other Supine Lumbar Exercises ball squeeze x 10 - 5 sec hold      Lumbar Exercises: Prone   Other Prone Lumbar Exercises prone lying x 60 sec                    PT Education - 05/25/21 0904     Education Details eval findings, HEP, tennis ball massage, POC    Person(s) Educated Patient    Methods Explanation;Demonstration;Handout    Comprehension Verbalized understanding;Returned demonstration              PT Short Term Goals - 05/25/21 0908       PT SHORT TERM GOAL #1   Title Pt will be compliant and knowledgeable with initial HEP for improved carryover    Baseline initial HEP given    Time 3    Period Weeks    Status New    Target Date 06/15/21      PT SHORT TERM GOAL #2   Title Pt will improve 5xSTS to no greater than 22 seconds for improved functional mobility and balance    Baseline 37 seconds    Time  3    Period Weeks    Status New    Target Date 06/15/21               PT Long Term Goals - 05/25/21 0909       PT LONG TERM GOAL #1   Title Pt will decrease ODI disability score to no greater than 35% as proxy for functional improvement    Baseline 51% disability    Time 8    Period Weeks    Status New     Target Date 07/20/21      PT LONG TERM GOAL #2   Title Pt will self report L sided LBP no greater than 3/10 at worst for improved comfort and function    Baseline 10/10 at worst    Time 8    Period Weeks    Status New    Target Date 07/20/21      PT LONG TERM GOAL #3   Title Pt will be able to stand 40 minutes without increase in LBP for improved comfort during ADLs and commmunity activities    Baseline 20 minutes    Time 8    Period Weeks    Status New    Target Date 07/20/21                    Plan - 05/25/21 0958     Clinical Impression Statement Pt is a 60 y/o F who presents to PT with reports of chronic L sided LBP and discomfort. Physical findings are consistent with referring provider impression, as she has palpable pain and limited lumbar AROM. Her ODI score indicates severe disability in the performance of home ADLs and functional ability. Likewise her 5xSTS time places her at a much higher risk for falls and shows she is operating well below her PLOF. She would benefit from skilled PT services working on improving core and proximal hip strength and lumbar AROM in order to decrease pain and improve function.    Personal Factors and Comorbidities Time since onset of injury/illness/exacerbation;Comorbidity 1    Comorbidities DM II    Examination-Activity Limitations Squat;Stairs;Stand;Lift;Bend;Carry;Reach Overhead    Examination-Participation Restrictions Community Activity;Volunteer;Meal Prep;Cleaning    Stability/Clinical Decision Making Stable/Uncomplicated    Clinical Decision Making Low    Rehab Potential Good    PT Frequency 1x / week    PT Duration 3 weeks    PT Treatment/Interventions ADLs/Self Care Home Management;Electrical Stimulation;Cryotherapy;Moist Heat;Traction;Ultrasound;Gait training;Stair training;Functional mobility training;Therapeutic activities;Therapeutic exercise;Balance training;Neuromuscular re-education;Patient/family education;Manual  techniques;Passive range of motion;Dry needling;Taping;Vasopneumatic Device;Spinal Manipulations;Joint Manipulations    PT Next Visit Plan assess response to HEP; STM to L lumbar paraspinals; progress core and extension based exercises as able    PT Home Exercise Plan Access Code: [email or web address]    Consulted and Agree with Plan of Care Patient             Patient will benefit from skilled therapeutic intervention in order to improve the following deficits and impairments:  Abnormal gait, Decreased activity tolerance, Decreased balance, Decreased endurance, Decreased mobility, Decreased range of motion, Decreased strength, Difficulty walking, Hypomobility, Increased muscle spasms, Impaired flexibility, Postural dysfunction, Pain  Visit Diagnosis: Chronic left-sided low back pain, unspecified whether sciatica present  Muscle weakness (generalized)  Cramp and spasm  Other abnormalities of gait and mobility     Problem List Patient Active Problem List   Diagnosis Date Noted   Type 2 diabetes mellitus with hyperglycemia ([place]) 12/19/2019   Fibroid 09/04/2015  Menorrhagia 12/17/2011    [name], PT, DPT 05/25/21 10:05 AM  [facility] [address] [place], [place], [address] Phone: [phone]   Fax:  [phone]  Name: Lunarose Dapp MRN: [id number] Date of Birth: [birth year]

## 2021-06-01 ENCOUNTER — Ambulatory Visit: Payer: Medicaid Other

## 2021-06-03 ENCOUNTER — Ambulatory Visit: Payer: Self-pay

## 2021-06-03 ENCOUNTER — Ambulatory Visit: Payer: Medicaid Other | Attending: Physical Medicine and Rehabilitation

## 2021-06-03 ENCOUNTER — Inpatient Hospital Stay: Admission: RE | Admit: 2021-06-03 | Payer: Self-pay | Source: Ambulatory Visit

## 2021-06-03 ENCOUNTER — Other Ambulatory Visit: Payer: Self-pay

## 2021-06-03 DIAGNOSIS — G8929 Other chronic pain: Secondary | ICD-10-CM | POA: Insufficient documentation

## 2021-06-03 DIAGNOSIS — R252 Cramp and spasm: Secondary | ICD-10-CM | POA: Insufficient documentation

## 2021-06-03 DIAGNOSIS — M545 Low back pain, unspecified: Secondary | ICD-10-CM | POA: Diagnosis present

## 2021-06-03 DIAGNOSIS — M6281 Muscle weakness (generalized): Secondary | ICD-10-CM | POA: Insufficient documentation

## 2021-06-03 DIAGNOSIS — R2689 Other abnormalities of gait and mobility: Secondary | ICD-10-CM | POA: Diagnosis present

## 2021-06-03 NOTE — Therapy (Signed)
[place], [place], [address] Phone: [phone]   Fax:  [phone]  Physical Therapy Treatment  Patient Details  Name: Roberta Jenkins MRN: [id number] Date of Birth: February 16, 1961 Referring Provider (PT): Lorine Bears, NP   Encounter Date: 06/03/2021   PT End of Session - 06/03/21 1117     Visit Number 2    Number of Visits 13    Date for PT Re-Evaluation 07/20/21    Authorization Type [place] MCD - CCME    Authorization Time Period Approved 3 Initial PT visits from 06/03/2021-06/21/2021    Authorization - Visit Number 1    Authorization - Number of Visits 3    PT Start Time 1130    PT Stop Time 1208    PT Time Calculation (min) 38 min    Activity Tolerance Patient tolerated treatment well;No increased pain    Behavior During Therapy [facility] for tasks assessed/performed             Past Medical History:  Diagnosis Date   Back pain    Diabetes mellitus ([place])    Glaucoma    Hyperlipidemia    Menorrhagia 12/17/2011   No pertinent past medical history     Past Surgical History:  Procedure Laterality Date   [place]   2x   MULTIPLE TOOTH EXTRACTIONS      There were no vitals filed for this visit.   Subjective Assessment - 06/03/21 1133     Subjective Pt presents to PT with reports of continued L sided lower back pain over the past week, but none right now. It has been a 9/10 at worst since last PT visit. She is ready to begin PT treatment at this time.    Currently in Pain? No/denies    Pain Score 0-No pain                               [facility] Adult PT Treatment/Exercise - 06/03/21 0001       Lumbar Exercises: Stretches   Lower Trunk Rotation Limitations x 10    Prone on Elbows Stretch Limitations x 60 sec    Press Ups Limitations 2x10      Lumbar Exercises: Aerobic   Nustep lvl 5 x 4 min UE/LE while taking subjective      Lumbar Exercises:  Standing   Other Standing Lumbar Exercises standing hip ext 2x10    Other Standing Lumbar Exercises standing pallof press 2x10 GTB ea      Lumbar Exercises: Supine   Bridge Limitations 2x10      Manual Therapy   Manual therapy comments STM to L sided lumbar paraspinals in prone                      PT Short Term Goals - 05/25/21 0908       PT SHORT TERM GOAL #1   Title Pt will be compliant and knowledgeable with initial HEP for improved carryover    Baseline initial HEP given    Time 3    Period Weeks    Status New    Target Date 06/15/21      PT SHORT TERM GOAL #2   Title Pt will improve 5xSTS to no greater than 22 seconds for improved functional mobility and balance    Baseline 37 seconds    Time 3  Period Weeks    Status New    Target Date 06/15/21               PT Long Term Goals - 05/25/21 0909       PT LONG TERM GOAL #1   Title Pt will decrease ODI disability score to no greater than 35% as proxy for functional improvement    Baseline 51% disability    Time 8    Period Weeks    Status New    Target Date 07/20/21      PT LONG TERM GOAL #2   Title Pt will self report L sided LBP no greater than 3/10 at worst for improved comfort and function    Baseline 10/10 at worst    Time 8    Period Weeks    Status New    Target Date 07/20/21      PT LONG TERM GOAL #3   Title Pt will be able to stand 40 minutes without increase in LBP for improved comfort during ADLs and commmunity activities    Baseline 20 minutes    Time 8    Period Weeks    Status New    Target Date 07/20/21                   Plan - 06/03/21 1145     Clinical Impression Statement Pt was able to complete all prescribed exercises with no adverse effect. Today's session focused on directional preference stretches and core/proximal hip strengthening. HEP updated for continued strengthening exercises at home. Pt responded well to manual therapy interventions, reporting  decreased discomfort at end of session. Will continue to progress pt as tolerated per POC.    PT Treatment/Interventions ADLs/Self Care Home Management;Electrical Stimulation;Cryotherapy;Moist Heat;Traction;Ultrasound;Gait training;Stair training;Functional mobility training;Therapeutic activities;Therapeutic exercise;Balance training;Neuromuscular re-education;Patient/family education;Manual techniques;Passive range of motion;Dry needling;Taping;Vasopneumatic Device;Spinal Manipulations;Joint Manipulations    PT Next Visit Plan progress core and extension based exercises as able    PT Home Exercise Plan Access Code: [email or web address]             Patient will benefit from skilled therapeutic intervention in order to improve the following deficits and impairments:  Abnormal gait, Decreased activity tolerance, Decreased balance, Decreased endurance, Decreased mobility, Decreased range of motion, Decreased strength, Difficulty walking, Hypomobility, Increased muscle spasms, Impaired flexibility, Postural dysfunction, Pain  Visit Diagnosis: Chronic left-sided low back pain, unspecified whether sciatica present  Muscle weakness (generalized)  Cramp and spasm  Other abnormalities of gait and mobility     Problem List Patient Active Problem List   Diagnosis Date Noted   Type 2 diabetes mellitus with hyperglycemia ([place]) 12/19/2019   Fibroid 09/04/2015   Menorrhagia 12/17/2011    [name], PT, DPT 06/03/21 12:10 PM  [facility] [address] [place], [place], [address] Phone: [phone]   Fax:  [phone]  Name: Jalliyah Pert MRN: [id number] Date of Birth: 1961-06-29

## 2021-06-08 ENCOUNTER — Ambulatory Visit: Payer: Medicaid Other

## 2021-06-15 ENCOUNTER — Ambulatory Visit: Payer: Medicaid Other

## 2021-06-15 ENCOUNTER — Other Ambulatory Visit: Payer: Self-pay

## 2021-06-15 DIAGNOSIS — M545 Low back pain, unspecified: Secondary | ICD-10-CM | POA: Diagnosis not present

## 2021-06-15 DIAGNOSIS — G8929 Other chronic pain: Secondary | ICD-10-CM

## 2021-06-15 DIAGNOSIS — M6281 Muscle weakness (generalized): Secondary | ICD-10-CM

## 2021-06-15 NOTE — Therapy (Signed)
[place], [place], [address] Phone: [phone]   Fax:  [phone]  Physical Therapy Treatment  Patient Details  Name: Roberta Jenkins MRN: [id number] Date of Birth: 15-Nov-1960 Referring Provider (PT): Lorine Bears, NP   Encounter Date: 06/15/2021   PT End of Session - 06/15/21 0825     Visit Number 3    Number of Visits 13    Date for PT Re-Evaluation 07/20/21    Authorization Type [place] MCD - CCME    Authorization Time Period Approved 3 Initial PT visits from 06/03/2021-06/21/2021    Authorization - Visit Number 2    Authorization - Number of Visits 3    PT Start Time 0830    PT Stop Time 0900   pt had to leave early d/t appt conflict with court   PT Time Calculation (min) 30 min    Activity Tolerance Patient tolerated treatment well;No increased pain    Behavior During Therapy [facility] for tasks assessed/performed             Past Medical History:  Diagnosis Date   Back pain    Diabetes mellitus ([place])    Glaucoma    Hyperlipidemia    Menorrhagia 12/17/2011   No pertinent past medical history     Past Surgical History:  Procedure Laterality Date   [place]   2x   MULTIPLE TOOTH EXTRACTIONS      There were no vitals filed for this visit.   Subjective Assessment - 06/15/21 0825     Subjective Pt presents to PT with no current reports of lower back pain. She has continued to be compliant with HEP with no adverse effect. She is ready to begin PT at this time.    How long can you sit comfortably? 60 minutes    How long can you stand comfortably? 15-20 min    How long can you walk comfortably? 35 minutes    Currently in Pain? No/denies    Pain Score 0-No pain                [facility] PT Assessment - 06/15/21 0001       Observation/Other Assessments   Oswestry Disability Index  52% disability      Transfers   Five time sit to stand comments  21 seconds                             [facility] Adult PT Treatment/Exercise:   Therapeutic Exercise: NuStep lvl 5 UE/LE x 5 min while taking subjective POE x 90 sec Prone press up 2x10 LTR x 10 ea Bridge 2x10 - 3 sec hold STS x 5 Standing hip ext 2x10 ea Mini squat holding freemtion handle 2x10         PT Short Term Goals - 06/15/21 0839       PT SHORT TERM GOAL #1   Title Pt will be compliant and knowledgeable with initial HEP for improved carryover    Baseline initial HEP given    Time 3    Period Weeks    Status Achieved    Target Date 06/15/21      PT SHORT TERM GOAL #2   Title Pt will improve 5xSTS to no greater than 22 seconds for improved functional mobility and balance    Baseline 37 seconds at eval; 06/15/21: 21 seconds    Time 3  Period Weeks    Status Achieved    Target Date 06/15/21               PT Long Term Goals - 06/15/21 0848       PT LONG TERM GOAL #1   Title Pt will decrease ODI disability score to no greater than 35% as proxy for functional improvement    Baseline 51% disability    Time 8    Period Weeks    Status New    Target Date 07/20/21      PT LONG TERM GOAL #2   Title Pt will self report L sided LBP no greater than 3/10 at worst for improved comfort and function    Baseline 10/10 at worst on eval; 06/15/21: 7/10 at worst    Time 8    Period Weeks    Status On-going    Target Date 07/20/21      PT LONG TERM GOAL #3   Title Pt will be able to stand 40 minutes without increase in LBP for improved comfort during ADLs and commmunity activities    Baseline 20 minutes at eval; 06/15/21: 15-20 min    Time 8    Period Weeks    Status On-going    Target Date 07/20/21                   Plan - 06/15/21 0903     Clinical Impression Statement Pt was once again able to complete prescribed exercises with on adverse effect. Over the course of PT treatment she has improve her strength and functional mobility, as  evidenced by decreased 5xSTS time to 21 seconds compared to 37 seconds at eval. She also has noticed decreased LBP at worst threshold, noting 7/10 pain at worst in past week. Her ODI score remains unchanged and she continues to have difficulty with home ADLs due to decreased standing tolerance secondary to LBP. Pt would benefit from continued PT working on improving strength and progressing extension based exercises in order to decrease pain and improve function.    PT Frequency 2x / week    PT Duration 4 weeks    PT Treatment/Interventions ADLs/Self Care Home Management;Electrical Stimulation;Cryotherapy;Moist Heat;Traction;Ultrasound;Gait training;Stair training;Functional mobility training;Therapeutic activities;Therapeutic exercise;Balance training;Neuromuscular re-education;Patient/family education;Manual techniques;Passive range of motion;Dry needling;Taping;Vasopneumatic Device;Spinal Manipulations;Joint Manipulations    PT Next Visit Plan progress core and extension based exercises as able    PT Home Exercise Plan Access Code: [email or web address]    Consulted and Agree with Plan of Care Patient             Patient will benefit from skilled therapeutic intervention in order to improve the following deficits and impairments:  Abnormal gait, Decreased activity tolerance, Decreased balance, Decreased endurance, Decreased mobility, Decreased range of motion, Decreased strength, Difficulty walking, Hypomobility, Increased muscle spasms, Impaired flexibility, Postural dysfunction, Pain  Visit Diagnosis: Chronic left-sided low back pain, unspecified whether sciatica present  Muscle weakness (generalized)     Problem List Patient Active Problem List   Diagnosis Date Noted   Type 2 diabetes mellitus with hyperglycemia ([place]) 12/19/2019   Fibroid 09/04/2015   Menorrhagia 12/17/2011    [name], PT, DPT 06/15/21 9:11 AM  [facility] [address] [place], [place], [address] Phone: [phone]   Fax:  [phone]  Name: Roberta Jenkins MRN: [id number] Date of Birth: 08/15/1961

## 2021-06-22 ENCOUNTER — Ambulatory Visit: Payer: Medicaid Other

## 2021-06-22 ENCOUNTER — Other Ambulatory Visit: Payer: Self-pay

## 2021-06-22 DIAGNOSIS — M6281 Muscle weakness (generalized): Secondary | ICD-10-CM

## 2021-06-22 DIAGNOSIS — R252 Cramp and spasm: Secondary | ICD-10-CM

## 2021-06-22 DIAGNOSIS — M545 Low back pain, unspecified: Secondary | ICD-10-CM

## 2021-06-22 NOTE — Therapy (Addendum)
[place], [place], [address] Phone: [phone]   Fax:  [phone]  Physical Therapy Treatment/Discharge  Patient Details  Name: Roberta Jenkins MRN: [id number] Date of Birth: 1960/12/18 Referring Provider (PT): Lorine Bears, NP   Encounter Date: 06/22/2021   PT End of Session - 06/22/21 0830     Visit Number 4    Number of Visits 13    Date for PT Re-Evaluation 07/20/21    Authorization Type [place] [facility] - CCME    Authorization Time Period CCME approved 8 PT visits from 06/22/2021-07/19/2021    Authorization - Visit Number 1    Authorization - Number of Visits 8    PT Start Time 0830    PT Stop Time 0908    PT Time Calculation (min) 38 min    Activity Tolerance Patient tolerated treatment well;No increased pain    Behavior During Therapy [facility] for tasks assessed/performed             Past Medical History:  Diagnosis Date   Back pain    Diabetes mellitus ([place])    Glaucoma    Hyperlipidemia    Menorrhagia 12/17/2011   No pertinent past medical history     Past Surgical History:  Procedure Laterality Date   [place]   2x   MULTIPLE TOOTH EXTRACTIONS      There were no vitals filed for this visit.   Subjective Assessment - 06/22/21 0830     Subjective Pt presents to PT with no current reports of lower back pain or discomfort. She has been compliant with her HEP with no adverse effect. Pt is ready to begin PT treatment at this time.    Currently in Pain? No/denies    Pain Score 0-No pain            [facility] Adult PT Treatment/Exercise:   Therapeutic Exercise: NuStep lvl 5 UE/LE x 4 min while taking subjective POE x 2 min Prone press up 2x10 LTR x 10 ea Bridge 3x10 - 3 sec hold STS x 5 Standing hip ext 2x10 ea Mini squat holding freemtion handle 2x10 Pallof press 2x10 3lbs ea                             PT Short Term Goals -  06/15/21 0839       PT SHORT TERM GOAL #1   Title Pt will be compliant and knowledgeable with initial HEP for improved carryover    Baseline initial HEP given    Time 3    Period Weeks    Status Achieved    Target Date 06/15/21      PT SHORT TERM GOAL #2   Title Pt will improve 5xSTS to no greater than 22 seconds for improved functional mobility and balance    Baseline 37 seconds at eval; 06/15/21: 21 seconds    Time 3    Period Weeks    Status Achieved    Target Date 06/15/21               PT Long Term Goals - 06/15/21 0848       PT LONG TERM GOAL #1   Title Pt will decrease ODI disability score to no greater than 35% as proxy for functional improvement    Baseline 51% disability    Time 8    Period Weeks    Status  New    Target Date 07/20/21      PT LONG TERM GOAL #2   Title Pt will self report L sided LBP no greater than 3/10 at worst for improved comfort and function    Baseline 10/10 at worst on eval; 06/15/21: 7/10 at worst    Time 8    Period Weeks    Status On-going    Target Date 07/20/21      PT LONG TERM GOAL #3   Title Pt will be able to stand 40 minutes without increase in LBP for improved comfort during ADLs and commmunity activities    Baseline 20 minutes at eval; 06/15/21: 15-20 min    Time 8    Period Weeks    Status On-going    Target Date 07/20/21                   Plan - 06/22/21 0843     Clinical Impression Statement Pt was again able to complete all prescribed exercises with no adverse effect or increase in pain. She continues to demonstrate extension preference and improving strength and activity tolerance. Pt continues to benefit from skilled PT services and should continue to be seen and progressed as able.    PT Treatment/Interventions ADLs/Self Care Home Management;Electrical Stimulation;Cryotherapy;Moist Heat;Traction;Ultrasound;Gait training;Stair training;Functional mobility training;Therapeutic activities;Therapeutic  exercise;Balance training;Neuromuscular re-education;Patient/family education;Manual techniques;Passive range of motion;Dry needling;Taping;Vasopneumatic Device;Spinal Manipulations;Joint Manipulations    PT Next Visit Plan progress core and extension based exercises as able    PT Home Exercise Plan Access Code: [email or web address]             Patient will benefit from skilled therapeutic intervention in order to improve the following deficits and impairments:  Abnormal gait, Decreased activity tolerance, Decreased balance, Decreased endurance, Decreased mobility, Decreased range of motion, Decreased strength, Difficulty walking, Hypomobility, Increased muscle spasms, Impaired flexibility, Postural dysfunction, Pain  Visit Diagnosis: Chronic left-sided low back pain, unspecified whether sciatica present  Muscle weakness (generalized)  Cramp and spasm     Problem List Patient Active Problem List   Diagnosis Date Noted   Type 2 diabetes mellitus with hyperglycemia ([place]) 12/19/2019   Fibroid 09/04/2015   Menorrhagia 12/17/2011    [name], PT, DPT 06/22/21 9:10 AM  [facility] [address] [place], [place], [address] Phone: [phone]   Fax:  [phone]  Name: Roberta Jenkins MRN: [id number] Date of Birth: 10/09/1961  PHYSICAL THERAPY DISCHARGE SUMMARY  Visits from Start of Care: 4  Current functional level related to goals / functional outcomes: Unable to assess   Remaining deficits: Unable to assess   Education / Equipment: Unable to assess   Patient agrees to discharge. Patient goals were  Unable to assess . Patient is being discharged due to not returning since the last visit.

## 2021-06-29 ENCOUNTER — Telehealth: Payer: Self-pay

## 2021-06-29 ENCOUNTER — Ambulatory Visit: Payer: Medicaid Other

## 2021-06-29 NOTE — Telephone Encounter (Signed)
PT attempted to call patient regarding missed visit on 8/29, but did not get an answer or option to leave a voicemail.   Patient has more CCME authorized sessions, but today was her last scheduled appointment.   Will attempt to call patient again, no further action required at this time.   [name], PT, DPT 06/29/21 11:57 AM

## 2021-07-17 ENCOUNTER — Other Ambulatory Visit (HOSPITAL_COMMUNITY): Payer: Self-pay

## 2021-08-03 ENCOUNTER — Encounter: Payer: Self-pay | Admitting: Nurse Practitioner

## 2021-08-03 ENCOUNTER — Ambulatory Visit: Payer: Medicaid Other | Attending: Nurse Practitioner | Admitting: Nurse Practitioner

## 2021-08-03 ENCOUNTER — Other Ambulatory Visit: Payer: Self-pay

## 2021-08-03 VITALS — BP 111/75 | HR 76 | Ht 68.0 in | Wt 187.2 lb

## 2021-08-03 DIAGNOSIS — I10 Essential (primary) hypertension: Secondary | ICD-10-CM | POA: Insufficient documentation

## 2021-08-03 DIAGNOSIS — Z79899 Other long term (current) drug therapy: Secondary | ICD-10-CM | POA: Diagnosis not present

## 2021-08-03 DIAGNOSIS — E785 Hyperlipidemia, unspecified: Secondary | ICD-10-CM | POA: Insufficient documentation

## 2021-08-03 DIAGNOSIS — D649 Anemia, unspecified: Secondary | ICD-10-CM | POA: Diagnosis not present

## 2021-08-03 DIAGNOSIS — Z8249 Family history of ischemic heart disease and other diseases of the circulatory system: Secondary | ICD-10-CM | POA: Diagnosis not present

## 2021-08-03 DIAGNOSIS — Z794 Long term (current) use of insulin: Secondary | ICD-10-CM | POA: Diagnosis not present

## 2021-08-03 DIAGNOSIS — E1165 Type 2 diabetes mellitus with hyperglycemia: Secondary | ICD-10-CM | POA: Diagnosis present

## 2021-08-03 MED ORDER — LANTUS SOLOSTAR 100 UNIT/ML ~~LOC~~ SOPN: 16.0000 [IU] | PEN_INJECTOR | Freq: Every day | SUBCUTANEOUS | 1 refills | Status: DC

## 2021-08-03 MED ORDER — TRUEPLUS 5-BEVEL PEN NEEDLES 32G X 4 MM MISC: 2 refills | Status: DC

## 2021-08-03 NOTE — Progress Notes (Signed)
Assessment & Plan:  Roberta Jenkins was seen today for diabetes.  Diagnoses and all orders for this visit:  Type 2 diabetes mellitus with hyperglycemia, with long-term current use of insulin (HCC) -     insulin glargine (LANTUS SOLOSTAR) 100 UNIT/ML Solostar Pen; Inject 16 Units into the skin daily. -     Insulin Pen Needle (TRUEPLUS 5-BEVEL PEN NEEDLES) 32G X 4 MM MISC; Use to inject Basaglar once daily. E11.65 -     CMP14+EGFR -     Microalbumin / creatinine urine ratio  Dyslipidemia, goal LDL below 70 -     Lipid panel  Low hemoglobin -     CBC   Patient has been counseled on age-appropriate routine health concerns for screening and prevention. These are reviewed and up-to-date. Referrals have been placed accordingly. Immunizations are up-to-date or declined.    Subjective:   Chief Complaint  Patient presents with   Diabetes   Diabetes Pertinent negatives for hypoglycemia include no dizziness, headaches or seizures. Pertinent negatives for diabetes include no blurred vision, no chest pain and no weight loss.  Roberta Jenkins 60 y.o. female presents to office today for DM and HTN.  has a past medical history of Back pain, Diabetes mellitus ([place]), Glaucoma, Hyperlipidemia, Menorrhagia (12/17/2011), and No pertinent past medical history.   DM2  Well controlled. She is currently taking lantus 16 units daily as prescribed. Denies any symptoms of hypo or hyperglycemia. Blood pressure is well controlled. Cholesterol is not controlled. She endorses adherence taking atorvastatin 20 mg daily. Denies chest pain, shortness of breath, palpitations, lightheadedness, dizziness, headaches or BLE edema.   Lab Results  Component Value Date   HGBA1C 6.4 05/01/2021    BP Readings from Last 3 Encounters:  08/03/21 111/75  05/12/21 116/76  05/01/21 120/74   Lab Results  Component Value Date   LDLCALC 211 (H) 01/29/2021    Review of Systems  Constitutional:  Negative for fever,  malaise/fatigue and weight loss.  HENT: Negative.  Negative for nosebleeds.   Eyes: Negative.  Negative for blurred vision, double vision and photophobia.  Respiratory: Negative.  Negative for cough and shortness of breath.   Cardiovascular: Negative.  Negative for chest pain, palpitations and leg swelling.  Gastrointestinal: Negative.  Negative for heartburn, nausea and vomiting.  Musculoskeletal: Negative.  Negative for myalgias.  Neurological: Negative.  Negative for dizziness, focal weakness, seizures and headaches.  Psychiatric/Behavioral: Negative.  Negative for suicidal ideas.    Past Medical History:  Diagnosis Date   Back pain    Diabetes mellitus ([place])    Glaucoma    Hyperlipidemia    Menorrhagia 12/17/2011   No pertinent past medical history     Past Surgical History:  Procedure Laterality Date   CESAREAN SECTION  1981, 1989   2x   MULTIPLE TOOTH EXTRACTIONS      Family History  Problem Relation Age of Onset   Cancer Mother        colon   Cancer Father    Kidney disease Sister    Hypertension Brother     Social History Reviewed with no changes to be made today.   Outpatient Medications Prior to Visit  Medication Sig Dispense Refill   Accu-Chek Softclix Lancets lancets Use as instructed. Inject into the skin twice daily 100 each 3   atorvastatin (LIPITOR) 20 MG tablet Take 1 tablet (20 mg total) by mouth daily. 90 tablet 1   glucose blood (ACCU-CHEK GUIDE) test strip Use as instructed.  Check blood glucose by fingerstick twice per day. 100 each 12   latanoprost (XALATAN) 0.005 % ophthalmic solution SMARTSIG:1 Drop(s) In Eye(s) Every Evening     Multiple Vitamin (MULTIVITAMIN WITH MINERALS) TABS tablet Take 1 tablet by mouth daily.     traMADol (ULTRAM) 50 MG tablet Take 1 tablet (50 mg total) by mouth every 12 (twelve) hours as needed. 40 tablet 0   Travoprost, BAK Free, (TRAVATAN) 0.004 % SOLN ophthalmic solution Place 1 drop into both eyes at bedtime. Samples      Insulin Pen Needle (TRUEPLUS 5-BEVEL PEN NEEDLES) 32G X 4 MM MISC Use to inject Basaglar once daily. E11.65 100 each 2   metFORMIN (GLUCOPHAGE) 500 MG tablet Take 2 tablets (1,000 mg total) by mouth daily with breakfast. 180 tablet 1   insulin glargine (LANTUS SOLOSTAR) 100 UNIT/ML Solostar Pen Inject 15 Units into the skin daily. 13.5 mL 1   No facility-administered medications prior to visit.    No Known Allergies     Objective:    BP 111/75   Pulse 76   Ht 5' 8" (1.727 m)   Wt 187 lb 4 oz (84.9 kg)   LMP 08/09/2018 (Approximate)   SpO2 99%   BMI 28.47 kg/m  Wt Readings from Last 3 Encounters:  08/03/21 187 lb 4 oz (84.9 kg)  05/01/21 188 lb 3.2 oz (85.4 kg)  03/20/21 180 lb (81.6 kg)    Physical Exam Vitals and nursing note reviewed.  Constitutional:      Appearance: She is well-developed.  HENT:     Head: Normocephalic and atraumatic.  Cardiovascular:     Rate and Rhythm: Normal rate and regular rhythm.     Heart sounds: Normal heart sounds. No murmur heard.   No friction rub. No gallop.  Pulmonary:     Effort: Pulmonary effort is normal. No tachypnea or respiratory distress.     Breath sounds: Normal breath sounds. No decreased breath sounds, wheezing, rhonchi or rales.  Chest:     Chest wall: No tenderness.  Abdominal:     General: Bowel sounds are normal.     Palpations: Abdomen is soft.  Musculoskeletal:        General: Normal range of motion.     Cervical back: Normal range of motion.  Skin:    General: Skin is warm and dry.  Neurological:     Mental Status: She is alert and oriented to person, place, and time.     Coordination: Coordination normal.  Psychiatric:        Behavior: Behavior normal. Behavior is cooperative.        Thought Content: Thought content normal.        Judgment: Judgment normal.         Patient has been counseled extensively about nutrition and exercise as well as the importance of adherence with medications and regular  follow-up. The patient was given clear instructions to go to ER or return to medical center if symptoms don't improve, worsen or new problems develop. The patient verbalized understanding.   Follow-up: Return for PAP SMEAR THEN SEE ME IN 3 MONTHS DM/HTN.   Roberta Pounds, FNP-BC [facility] and [place] [facility], [place]   08/03/2021, 9:36 AM

## 2021-08-03 NOTE — Patient Instructions (Signed)
[facility] [address] [place], [place] [address]  pH# [phone]

## 2021-08-04 LAB — LIPID PANEL
Chol/HDL Ratio: 4.2 ratio (ref 0.0–4.4)
Cholesterol, Total: 241 mg/dL — ABNORMAL HIGH (ref 100–199)
HDL: 57 mg/dL (ref >39)
LDL Chol Calc (NIH): 160 mg/dL — ABNORMAL HIGH (ref 0–99)
Triglycerides: 135 mg/dL (ref 0–149)
VLDL Cholesterol Cal: 24 mg/dL (ref 5–40)

## 2021-08-04 LAB — CMP14+EGFR
ALT: 30 IU/L (ref 0–32)
AST: 15 IU/L (ref 0–40)
Albumin/Globulin Ratio: 1.6 (ref 1.2–2.2)
Albumin: 4.4 g/dL (ref 3.8–4.8)
Alkaline Phosphatase: 80 IU/L (ref 44–121)
BUN/Creatinine Ratio: 12 (ref 12–28)
BUN: 11 mg/dL (ref 8–27)
Bilirubin Total: 0.4 mg/dL (ref 0.0–1.2)
CO2: 20 mmol/L (ref 20–29)
Calcium: 9.7 mg/dL (ref 8.7–10.3)
Chloride: 104 mmol/L (ref 96–106)
Creatinine, Ser: 0.9 mg/dL (ref 0.57–1.00)
Globulin, Total: 2.7 g/dL (ref 1.5–4.5)
Glucose: 121 mg/dL — ABNORMAL HIGH (ref 70–99)
Potassium: 4.6 mmol/L (ref 3.5–5.2)
Sodium: 140 mmol/L (ref 134–144)
Total Protein: 7.1 g/dL (ref 6.0–8.5)
eGFR: 73 mL/min/{1.73_m2} (ref >59)

## 2021-08-04 LAB — CBC
Hematocrit: 40.6 % (ref 34.0–46.6)
Hemoglobin: 12.8 g/dL (ref 11.1–15.9)
MCH: 27.5 pg (ref 26.6–33.0)
MCHC: 31.5 g/dL (ref 31.5–35.7)
MCV: 87 fL (ref 79–97)
Platelets: 356 10*3/uL (ref 150–450)
RBC: 4.65 x10E6/uL (ref 3.77–5.28)
RDW: 11.7 % (ref 11.7–15.4)
WBC: 6.3 10*3/uL (ref 3.4–10.8)

## 2021-08-04 LAB — MICROALBUMIN / CREATININE URINE RATIO
Creatinine, Urine: 128 mg/dL
Microalb/Creat Ratio: 4 mg/g creat (ref 0–29)
Microalbumin, Urine: 5.5 ug/mL

## 2021-08-06 ENCOUNTER — Telehealth: Payer: Self-pay

## 2021-08-06 NOTE — Telephone Encounter (Signed)
-----   Message from Gildardo Pounds, NP sent at 08/05/2021  9:28 PM EDT ----- CBC does not indicate any anemia or bleeding disorders  Urine does not show any microscopic diabetic kidney changes.   Kidney, liver function and electrolytes are normal.   Cholesterol levels elevated. Make sur eyou are taking your atorvastatin every day as prescribed to help reduce your risk of heart attack or stroke.

## 2021-08-10 ENCOUNTER — Telehealth: Payer: Self-pay

## 2021-08-10 ENCOUNTER — Ambulatory Visit (INDEPENDENT_AMBULATORY_CARE_PROVIDER_SITE_OTHER): Payer: Medicaid Other | Admitting: Licensed Clinical Social Worker

## 2021-08-10 ENCOUNTER — Other Ambulatory Visit: Payer: Self-pay

## 2021-08-10 DIAGNOSIS — F4321 Adjustment disorder with depressed mood: Secondary | ICD-10-CM

## 2021-08-10 NOTE — Telephone Encounter (Signed)
-----   Message from Gildardo Pounds, NP sent at 08/05/2021  9:28 PM EDT ----- CBC does not indicate any anemia or bleeding disorders  Urine does not show any microscopic diabetic kidney changes.   Kidney, liver function and electrolytes are normal.   Cholesterol levels elevated. Make sur eyou are taking your atorvastatin every day as prescribed to help reduce your risk of heart attack or stroke.

## 2021-08-11 ENCOUNTER — Telehealth: Payer: Self-pay

## 2021-08-11 ENCOUNTER — Ambulatory Visit
Admission: RE | Admit: 2021-08-11 | Discharge: 2021-08-11 | Disposition: A | Discharge status: Home / Self Care | Payer: Medicaid Other | Source: Ambulatory Visit | Attending: Nurse Practitioner | Admitting: Nurse Practitioner

## 2021-08-11 DIAGNOSIS — Z1231 Encounter for screening mammogram for malignant neoplasm of breast: Secondary | ICD-10-CM

## 2021-08-11 NOTE — Telephone Encounter (Signed)
-----   Message from Gildardo Pounds, NP sent at 08/05/2021  9:28 PM EDT ----- CBC does not indicate any anemia or bleeding disorders  Urine does not show any microscopic diabetic kidney changes.   Kidney, liver function and electrolytes are normal.   Cholesterol levels elevated. Make sur eyou are taking your atorvastatin every day as prescribed to help reduce your risk of heart attack or stroke.

## 2021-08-12 NOTE — Progress Notes (Signed)
Comprehensive Clinical Assessment (CCA) Note  08/12/2021 Roberta Jenkins [id number]  Chief Complaint:  Chief Complaint  Patient presents with   Depression    Visit Diagnosis: Adjustment Dis, depressed   CCA Biopsychosocial Intake/Chief Complaint:  Coping with loss of health and work, feelings of sadness, depression.  Current Symptoms/Problems: wothlessness, poor sleep, limited income and not able to fully support self.  Coping with DM dx 1.5 yrs ago, glaucoma this yr, poor eye sight.  Patient Reported Schizophrenia/Schizoaffective Diagnosis in Past: No  Strengths: seeking help  Preferences: In person sesions, call her [place]  Abilities: Able to drive  Type of Services Patient Feels are Needed: Counseling, explore med management  Initial Clinical Notes/Concerns: LCSW reviewed informed consent for counseling with pt's full acknowledgement. Pt states she is struggling with not being able to provide for herself and having to ask her 3 sons to help support her. Sons are in their 36's, and 33's and pt lives with 2 of them, other son in [place] has never participated in counseling before per her report. 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 Pt finds it difficult not to be working. Pt's DM is poorly managed. Pt has not checked her BS in "2-3 mon" when asked. She states her lancet device failed and she has not followed up on another one. Pt is seen at [facility]. Pt agrees to f/u on lancet device today. Explore med mangement next session.  Mental Health Symptoms Depression:   Tearfulness; Worthlessness; Sleep (too much or little); Change in energy/activity; Fatigue   Duration of Depressive symptoms:  Greater than two weeks   Mania:   None   Anxiety:    Worrying; Tension   Psychosis:   None   Duration of Psychotic symptoms: No data recorded  Trauma:   None   Obsessions:   -- (Does not like elevators or any small  closed in spaces)   Compulsions:   None   Inattention:   None   Hyperactivity/Impulsivity:   None   Oppositional/Defiant Behaviors:   None   Emotional Irregularity:   Unstable self-image   Other Mood/Personality Symptoms:  No data recorded   Mental Status Exam Appearance and self-care  Stature:   Average   Weight:   Overweight   Clothing:   Casual   Grooming:   Normal   Cosmetic use:   Age appropriate   Posture/gait:   Tense; Other (Comment) (walking is very slow)   Motor activity:   Not Remarkable   Sensorium  Attention:   Normal   Concentration:   Normal   Orientation:   X5   Recall/memory:   Normal   Affect and Mood  Affect:   Depressed   Mood:   Depressed; Dysphoric   Relating  Eye contact:   Normal   Facial expression:   Depressed   Attitude toward examiner:   Cooperative   Thought and Language  Speech flow:  Normal   Thought content:   Appropriate to Mood and Circumstances   Preoccupation:   Ruminations   Hallucinations:   None   Organization:  No data recorded  [employer] of Knowledge:   -- (Needs investigation)   Intelligence:   Needs investigation   Abstraction:   Normal   Judgement:   Common-sensical   Reality Testing:   Adequate   Insight:   Present   Decision Making:   -- (Needs investigation)  Social Functioning  Social Maturity:   Isolates   Social Judgement:   Normal   Stress  Stressors:   Grief/losses; Illness; Financial; Work   Coping Ability:   Overwhelmed; Exhausted   Skill Deficits:   Self-care   Supports:   Family; Support needed     Religion:   Leisure/Recreation:   Exercise/Diet: Exercise/Diet Do You Have Any Trouble Sleeping?: Yes Explanation of Sleeping Difficulties: trouble falling and staying asleep  CCA Employment/Education Employment/Work Situation: Employment / Work Situation Employment Situation: On disability Why is Patient on  Disability: "my knee"  right How Long has Patient Been on Disability: Jan 2022 Has Patient ever Been in the [place]?: No  Education: Education Is Patient Currently Attending School?: No Last Grade Completed: 12 Did You Graduate From [employer]?: Yes Did You Attend College?: Yes What Type of College Degree Do you Have?: short time in tech school   CCA Family/Childhood History Family and Relationship History: Family history Marital status: Divorced Divorced, when?: ~12 yrs What types of issues is patient dealing with in the relationship?: No current relationship What is your sexual orientation?: heterosexual Does patient have children?: Yes How many children?: 3 How is patient's relationship with their children?: 3 sons, "good"  Childhood History:  Childhood History By whom was/is the patient raised?: Mother/father and step-parent Description of patient's relationship with caregiver when they were a child: "normal" Patient's description of current relationship with people who raised him/her: stepfather died 20+ yrs ago,  Talk on phone daily with mom in [place], [place] Does patient have siblings?: Yes Number of Siblings: 1 Description of patient's current relationship with siblings: brother "have not seen in 3-4 yrs". Minimal contact but "we love each other".   Half sister, no contact Did patient suffer any verbal/emotional/physical/sexual abuse as a child?: No Did patient suffer from severe childhood neglect?: No Has patient ever been sexually abused/assaulted/raped as an adolescent or adult?: No Witnessed domestic violence?: No Has patient been affected by domestic violence as an adult?: No   CCA Substance Use Alcohol/Drug Use: Alcohol / Drug Use History of alcohol / drug use?: No history of alcohol / drug abuse   DSM5 Diagnoses: Patient Active Problem List   Diagnosis Date Noted   Type 2 diabetes mellitus with hyperglycemia ([place]) 12/19/2019   Fibroid 09/04/2015    Menorrhagia 12/17/2011    Patient Centered Plan: Patient is on the following Treatment Plan(s):  Depression   Referrals to Alternative Service(s): Referred to Alternative Service(s):   Place:   Date:   Time:    Referred to Alternative Service(s):   Place:   Date:   Time:    Referred to Alternative Service(s):   Place:   Date:   Time:    Referred to Alternative Service(s):   Place:   Date:   Time:     Hermine Messick, LCSW

## 2021-08-14 ENCOUNTER — Telehealth: Payer: Self-pay

## 2021-08-14 NOTE — Telephone Encounter (Signed)
-----   Message from Gildardo Pounds, NP sent at 08/14/2021  1:20 PM EDT ----- NORMAL MAMMOGRAM. Repeat in one year

## 2021-09-16 ENCOUNTER — Other Ambulatory Visit (HOSPITAL_COMMUNITY)
Admission: RE | Admit: 2021-09-16 | Discharge: 2021-09-16 | Disposition: A | Discharge status: Home / Self Care | Payer: Medicaid Other | Source: Ambulatory Visit | Attending: Nurse Practitioner | Admitting: Nurse Practitioner

## 2021-09-16 ENCOUNTER — Ambulatory Visit: Payer: Medicaid Other | Attending: Nurse Practitioner | Admitting: Nurse Practitioner

## 2021-09-16 ENCOUNTER — Encounter: Payer: Self-pay | Admitting: Nurse Practitioner

## 2021-09-16 ENCOUNTER — Other Ambulatory Visit: Payer: Self-pay

## 2021-09-16 VITALS — BP 112/73 | HR 67 | Ht 68.0 in | Wt 190.2 lb

## 2021-09-16 DIAGNOSIS — Z124 Encounter for screening for malignant neoplasm of cervix: Secondary | ICD-10-CM | POA: Insufficient documentation

## 2021-09-16 DIAGNOSIS — Z114 Encounter for screening for human immunodeficiency virus [HIV]: Secondary | ICD-10-CM | POA: Diagnosis not present

## 2021-09-16 NOTE — Progress Notes (Addendum)
Assessment & Plan:  Roberta Jenkins was seen today for gynecologic exam.  Diagnoses and all orders for this visit:  Encounter for Papanicolaou smear for cervical cancer screening -     Cytology - PAP -     Cervicovaginal ancillary only  Encounter for screening for HIV -     HIV antibody (with reflex)   Patient has been counseled on age-appropriate routine health concerns for screening and prevention. These are reviewed and up-to-date. Referrals have been placed accordingly. Immunizations are up-to-date or declined.    Subjective:   Chief Complaint  Patient presents with   Gynecologic Exam   Roberta Jenkins 60 y.o. female presents to office today for annual PAP smear.   Review of Systems  Constitutional: Negative.  Negative for chills, fever, malaise/fatigue and weight loss.  Respiratory: Negative.  Negative for cough, shortness of breath and wheezing.   Cardiovascular: Negative.  Negative for chest pain, orthopnea and leg swelling.  Gastrointestinal:  Negative for abdominal pain.  Genitourinary: Negative.  Negative for flank pain.  Skin: Negative.  Negative for rash.  Psychiatric/Behavioral:  Negative for suicidal ideas.    Past Medical History:  Diagnosis Date   Back pain    Diabetes mellitus ([place])    Glaucoma    Hyperlipidemia    Menorrhagia 12/17/2011   No pertinent past medical history     Past Surgical History:  Procedure Laterality Date   CESAREAN SECTION  1981, 1989   2x   MULTIPLE TOOTH EXTRACTIONS      Family History  Problem Relation Age of Onset   Cancer Mother        colon   Cancer Father    Kidney disease Sister    Hypertension Brother     Social History Reviewed with no changes to be made today.   Outpatient Medications Prior to Visit  Medication Sig Dispense Refill   Accu-Chek Softclix Lancets lancets Use as instructed. Inject into the skin twice daily 100 each 3   atorvastatin (LIPITOR) 20 MG tablet Take 1 tablet (20 mg total) by mouth  daily. 90 tablet 1   glucose blood (ACCU-CHEK GUIDE) test strip Use as instructed. Check blood glucose by fingerstick twice per day. 100 each 12   insulin glargine (LANTUS SOLOSTAR) 100 UNIT/ML Solostar Pen Inject 16 Units into the skin daily. 15 mL 1   Insulin Pen Needle (TRUEPLUS 5-BEVEL PEN NEEDLES) 32G X 4 MM MISC Use to inject Basaglar once daily. E11.65 100 each 2   latanoprost (XALATAN) 0.005 % ophthalmic solution SMARTSIG:1 Drop(s) In Eye(s) Every Evening     Multiple Vitamin (MULTIVITAMIN WITH MINERALS) TABS tablet Take 1 tablet by mouth daily.     traMADol (ULTRAM) 50 MG tablet Take 1 tablet (50 mg total) by mouth every 12 (twelve) hours as needed. 40 tablet 0   Travoprost, BAK Free, (TRAVATAN) 0.004 % SOLN ophthalmic solution Place 1 drop into both eyes at bedtime. Samples     metFORMIN (GLUCOPHAGE) 500 MG tablet Take 2 tablets (1,000 mg total) by mouth daily with breakfast. 180 tablet 1   No facility-administered medications prior to visit.    No Known Allergies     Objective:    BP 112/73   Pulse 67   Ht 5\' 8"  (1.727 m)   Wt 190 lb 4 oz (86.3 kg)   LMP 08/09/2018 (Approximate)   SpO2 98%   BMI 28.93 kg/m  Wt Readings from Last 3 Encounters:  09/16/21 190 lb 4 oz (86.3  kg)  08/03/21 187 lb 4 oz (84.9 kg)  05/01/21 188 lb 3.2 oz (85.4 kg)    Physical Exam Exam conducted with a chaperone present.  Constitutional:      Appearance: She is well-developed.  HENT:     Head: Normocephalic.  Cardiovascular:     Rate and Rhythm: Normal rate and regular rhythm.     Heart sounds: Normal heart sounds.  Pulmonary:     Effort: Pulmonary effort is normal.     Breath sounds: Normal breath sounds.  Abdominal:     General: Bowel sounds are normal.     Palpations: Abdomen is soft.     Hernia: There is no hernia in the left inguinal area.  Genitourinary:    Exam position: Lithotomy position.     Labia:        Right: No rash, tenderness, lesion or injury.        Left: No  rash, tenderness, lesion or injury.      Vagina: Normal. No signs of injury and foreign body. No vaginal discharge, erythema, tenderness or bleeding.     Cervix: Discharge present. No cervical motion tenderness or friability.     Uterus: Not deviated and not enlarged.      Adnexa:        Right: No mass, tenderness or fullness.         Left: No mass, tenderness or fullness.       Rectum: Normal. No external hemorrhoid.  Lymphadenopathy:     Lower Body: No right inguinal adenopathy. No left inguinal adenopathy.  Skin:    General: Skin is warm and dry.  Neurological:     Mental Status: She is alert and oriented to person, place, and time.  Psychiatric:        Behavior: Behavior normal.        Thought Content: Thought content normal.        Judgment: Judgment normal.         Patient has been counseled extensively about nutrition and exercise as well as the importance of adherence with medications and regular follow-up. The patient was given clear instructions to go to ER or return to medical center if symptoms don't improve, worsen or new problems develop. The patient verbalized understanding.   Follow-up: Return if symptoms worsen or fail to improve.   Gildardo Pounds, FNP-BC [facility] and [facility] [place], [place]   09/16/2021, 10:27 AM

## 2021-09-17 LAB — CERVICOVAGINAL ANCILLARY ONLY
Bacterial Vaginitis (gardnerella): NEGATIVE
Candida Glabrata: NEGATIVE
Candida Vaginitis: NEGATIVE
Chlamydia: NEGATIVE
Comment: NEGATIVE
Comment: NEGATIVE
Comment: NEGATIVE
Comment: NEGATIVE
Comment: NEGATIVE
Comment: NORMAL
Neisseria Gonorrhea: NEGATIVE
Trichomonas: NEGATIVE

## 2021-09-22 ENCOUNTER — Other Ambulatory Visit: Payer: Self-pay | Admitting: Physician Assistant

## 2021-09-22 DIAGNOSIS — G8929 Other chronic pain: Secondary | ICD-10-CM

## 2021-09-22 LAB — CYTOLOGY - PAP
Comment: NEGATIVE
Diagnosis: NEGATIVE
High risk HPV: NEGATIVE

## 2021-09-28 ENCOUNTER — Telehealth: Payer: Self-pay

## 2021-09-28 NOTE — Telephone Encounter (Signed)
-----   Message from Gildardo Pounds, NP sent at 09/26/2021  6:53 PM EST ----- NORMAL PAP. REPEAT In 3 years

## 2021-09-28 NOTE — Telephone Encounter (Signed)
Called and no option to leave a vm.

## 2021-10-14 ENCOUNTER — Ambulatory Visit (INDEPENDENT_AMBULATORY_CARE_PROVIDER_SITE_OTHER): Payer: Medicaid Other | Admitting: Licensed Clinical Social Worker

## 2021-10-14 ENCOUNTER — Other Ambulatory Visit: Payer: Self-pay

## 2021-10-14 DIAGNOSIS — F4321 Adjustment disorder with depressed mood: Secondary | ICD-10-CM | POA: Diagnosis not present

## 2021-10-15 NOTE — Progress Notes (Signed)
° °  THERAPIST PROGRESS NOTE  Session Time: 45 min  Participation Level: Active  Behavioral Response: CasualAlertDepressed  Type of Therapy: Individual Therapy  Treatment Goals addressed: Communication: dep/coping  Interventions: Supportive and Other: grief edu  Summary: Roberta Jenkins is a 60 y.o. female who presents with hx of adjustment dis/dep. This date pt returns for first session since initial session. Pt reports no significant changes since last session with exception of increased back discomfort and vision changes. Pt agrees to f/u with eye dr and PCP. Exploration of DM management reveals she is paying closer attention and did get new meter for BS checks. Last check BS was 95 per pt. Additional edu provided on DM management. LCSW facilitated additional assessment of loss/grief. Pt reports in addition to loss of health and work she lost a niece and nephew both to murder in 02/07/13 and February 08, 2016. Both died young of gun shot injuries. Niece's killer never identified. Nephew's killer convicted. These are her brother's children. Pt got no assist with grief. Remainder of session spent on addressing edu r/t grief and providing grief counseling. Pt receptive. Literature on grief provided for pt to take with her. LCSW reviewed poc including scheduling prior to close of session. Pt states appreciation for care.    Suicidal/Homicidal: Nowithout intent/plan  Therapist Response: Pt receptive to care  Plan: Return again in ~3 weeks.  Diagnosis: Axis I: Adjustment Disorder with Depressed Mood   Hermine Messick, LCSW 10/15/2021

## 2021-11-05 ENCOUNTER — Ambulatory Visit (HOSPITAL_COMMUNITY): Payer: Self-pay | Admitting: Licensed Clinical Social Worker

## 2021-11-26 ENCOUNTER — Ambulatory Visit (HOSPITAL_COMMUNITY): Payer: Self-pay | Admitting: Licensed Clinical Social Worker

## 2021-12-04 ENCOUNTER — Encounter: Payer: Self-pay | Admitting: Nurse Practitioner

## 2021-12-04 ENCOUNTER — Other Ambulatory Visit: Payer: Self-pay

## 2021-12-04 ENCOUNTER — Encounter (HOSPITAL_COMMUNITY): Payer: Self-pay | Admitting: Physician Assistant

## 2021-12-04 ENCOUNTER — Ambulatory Visit (INDEPENDENT_AMBULATORY_CARE_PROVIDER_SITE_OTHER): Payer: Medicaid Other | Admitting: Physician Assistant

## 2021-12-04 ENCOUNTER — Ambulatory Visit: Payer: Medicaid Other | Attending: Nurse Practitioner | Admitting: Nurse Practitioner

## 2021-12-04 VITALS — BP 108/72 | HR 73 | Ht 68.0 in | Wt 190.5 lb

## 2021-12-04 VITALS — BP 106/77 | HR 63 | Temp 98.7°F | Ht 68.0 in | Wt 188.0 lb

## 2021-12-04 DIAGNOSIS — E785 Hyperlipidemia, unspecified: Secondary | ICD-10-CM

## 2021-12-04 DIAGNOSIS — M545 Low back pain, unspecified: Secondary | ICD-10-CM

## 2021-12-04 DIAGNOSIS — Z794 Long term (current) use of insulin: Secondary | ICD-10-CM

## 2021-12-04 DIAGNOSIS — F411 Generalized anxiety disorder: Secondary | ICD-10-CM

## 2021-12-04 DIAGNOSIS — Z7984 Long term (current) use of oral hypoglycemic drugs: Secondary | ICD-10-CM | POA: Insufficient documentation

## 2021-12-04 DIAGNOSIS — Z79899 Other long term (current) drug therapy: Secondary | ICD-10-CM | POA: Diagnosis not present

## 2021-12-04 DIAGNOSIS — F4321 Adjustment disorder with depressed mood: Secondary | ICD-10-CM

## 2021-12-04 DIAGNOSIS — H409 Unspecified glaucoma: Secondary | ICD-10-CM | POA: Diagnosis not present

## 2021-12-04 DIAGNOSIS — G8929 Other chronic pain: Secondary | ICD-10-CM

## 2021-12-04 DIAGNOSIS — E1165 Type 2 diabetes mellitus with hyperglycemia: Secondary | ICD-10-CM

## 2021-12-04 LAB — POCT GLYCOSYLATED HEMOGLOBIN (HGB A1C): Hemoglobin A1C: 5.7 % — AB (ref 4.0–5.6)

## 2021-12-04 LAB — GLUCOSE, POCT (MANUAL RESULT ENTRY): POC Glucose: 136 mg/dl — AB (ref 70–99)

## 2021-12-04 MED ORDER — HYDROXYZINE HCL 10 MG PO TABS: 10.0000 mg | ORAL_TABLET | Freq: Three times a day (TID) | ORAL | 1 refills | Status: AC | PRN

## 2021-12-04 MED ORDER — METFORMIN HCL 500 MG PO TABS: 1000.0000 mg | ORAL_TABLET | Freq: Every day | ORAL | 1 refills | Status: DC

## 2021-12-04 MED ORDER — LANTUS SOLOSTAR 100 UNIT/ML ~~LOC~~ SOPN: 16.0000 [IU] | PEN_INJECTOR | Freq: Every day | SUBCUTANEOUS | 1 refills | Status: DC

## 2021-12-04 MED ORDER — ACCU-CHEK SOFTCLIX LANCETS MISC: 3 refills | Status: DC

## 2021-12-04 MED ORDER — SERTRALINE HCL 50 MG PO TABS: 50.0000 mg | ORAL_TABLET | Freq: Every day | ORAL | 1 refills | Status: AC

## 2021-12-04 MED ORDER — ACCU-CHEK SOFTCLIX LANCET DEV KIT: PACK | 0 refills | Status: AC

## 2021-12-04 MED ORDER — ATORVASTATIN CALCIUM 20 MG PO TABS: 20.0000 mg | ORAL_TABLET | Freq: Every day | ORAL | 1 refills | Status: DC

## 2021-12-04 MED ORDER — TRAMADOL HCL 50 MG PO TABS: 50.0000 mg | ORAL_TABLET | Freq: Two times a day (BID) | ORAL | 0 refills | Status: DC | PRN

## 2021-12-04 NOTE — Progress Notes (Signed)
Assessment & Plan:  Roberta Jenkins was seen today for diabetes.  Diagnoses and all orders for this visit:  Type 2 diabetes mellitus with hyperglycemia, with long-term current use of insulin (HCC) -     POCT glucose (manual entry) -     POCT glycosylated hemoglobin (Hb A1C) -     insulin glargine (LANTUS SOLOSTAR) 100 UNIT/ML Solostar Pen; Inject 16 Units into the skin daily. -     metFORMIN (GLUCOPHAGE) 500 MG tablet; Take 2 tablets (1,000 mg total) by mouth daily with breakfast. -     Accu-Chek Softclix Lancets lancets; Use as instructed. Inject into the skin twice daily -     CMP14+EGFR -     Lancets Misc. (ACCU-CHEK SOFTCLIX LANCET DEV) KIT; Use as instructed. Check blood glucose level by fingerstick TWICE per day.  Hyperlipidemia, unspecified hyperlipidemia type -     atorvastatin (LIPITOR) 20 MG tablet; Take 1 tablet (20 mg total) by mouth daily.  Chronic left-sided low back pain without sciatica -     traMADol (ULTRAM) 50 MG tablet; Take 1 tablet (50 mg total) by mouth every 12 (twelve) hours as needed. Work on losing weight to help reduce back pain. May alternate with heat and ice application for pain relief. May also alternate with acetaminophen and Ibuprofen as prescribed for back pain. Other alternatives include PT, massage, acupuncture and water aerobics.     Patient has been counseled on age-appropriate routine health concerns for screening and prevention. These are reviewed and up-to-date. Referrals have been placed accordingly. Immunizations are up-to-date or declined.    Subjective:   Chief Complaint  Patient presents with   Diabetes   HPI Roberta Jenkins 61 y.o. female presents to office today for follow-up to diabetes. She has a past medical history of chronic back pain, Diabetes mellitus ([place]), Glaucoma, Hyperlipidemia, Menorrhagia (12/17/2011), and No pertinent past medical history.   DM 2 Requesting a new lancet pen today as the 2 she currently has have  broken. Diabetes is well controlled with Lantus 16 units daily and metformin 1000 mg daily.  LDL is not at goal with atorvastatin 20 mg daily. Lab Results  Component Value Date   HGBA1C 5.7 (A) 12/04/2021    Lab Results  Component Value Date   LDLCALC 160 (H) 08/03/2021     BACK PAIN She has chronic left-sided lumbar back pain and will need to follow-up with PMR for this.  I have refilled her tramadol to help relieve her pain.  She has worked with a physical therapist in the past but reports symptoms have worsened over the past several weeks. Per physical medicine and rehab notes on 05/12/2021:  Patient describes pain as soreness that increases with lifting, bending over and prolonged standing. Patient rates pain 8 out of 10 at this time.  Patient reports some relief of pain when lying flat, sitting and use of Tramadol as needed. Patient had left L4-L5 intra-articular facet injections in February 2022 with 40 to 50% pain relief for 1 day.  Patient's lumbar MRI from 2021 exhibits prominent facet degenerative changes at L4-L5 and L5-S1.  Patient denies any focal weakness, numbness, and tingling.  Patient denies any recent trauma or falls. If she does not get much relief with the physical therapy then the next step is medial branch nerve blocks with possible RFA discussed with patient as well and will follow-up for re-evaluation after physical therapy is complete.    Review of Systems  Constitutional:  Negative for fever,  malaise/fatigue and weight loss.  HENT: Negative.  Negative for nosebleeds.   Eyes: Negative.  Negative for blurred vision, double vision and photophobia.  Respiratory: Negative.  Negative for cough and shortness of breath.   Cardiovascular: Negative.  Negative for chest pain, palpitations and leg swelling.  Gastrointestinal: Negative.  Negative for heartburn, nausea and vomiting.  Musculoskeletal:  Positive for back pain and joint pain. Negative for myalgias.  Neurological:  Negative.  Negative for dizziness, focal weakness, seizures and headaches.  Psychiatric/Behavioral: Negative.  Negative for suicidal ideas.    Past Medical History:  Diagnosis Date   Back pain    Diabetes mellitus ([place])    Glaucoma    Hyperlipidemia    Menorrhagia 12/17/2011   No pertinent past medical history     Past Surgical History:  Procedure Laterality Date   CESAREAN SECTION  1981, 1989   2x   MULTIPLE TOOTH EXTRACTIONS      Family History  Problem Relation Age of Onset   Cancer Mother        colon   Cancer Father    Kidney disease Sister    Hypertension Brother     Social History Reviewed with no changes to be made today.   Outpatient Medications Prior to Visit  Medication Sig Dispense Refill   glucose blood (ACCU-CHEK GUIDE) test strip Use as instructed. Check blood glucose by fingerstick twice per day. 100 each 12   Insulin Pen Needle (TRUEPLUS 5-BEVEL PEN NEEDLES) 32G X 4 MM MISC Use to inject Basaglar once daily. E11.65 100 each 2   latanoprost (XALATAN) 0.005 % ophthalmic solution SMARTSIG:1 Drop(s) In Eye(s) Every Evening     Multiple Vitamin (MULTIVITAMIN WITH MINERALS) TABS tablet Take 1 tablet by mouth daily.     Travoprost, BAK Free, (TRAVATAN) 0.004 % SOLN ophthalmic solution Place 1 drop into both eyes at bedtime. Samples     Accu-Chek Softclix Lancets lancets Use as instructed. Inject into the skin twice daily 100 each 3   atorvastatin (LIPITOR) 20 MG tablet Take 1 tablet (20 mg total) by mouth daily. 90 tablet 1   traMADol (ULTRAM) 50 MG tablet Take 1 tablet (50 mg total) by mouth every 12 (twelve) hours as needed. 40 tablet 0   insulin glargine (LANTUS SOLOSTAR) 100 UNIT/ML Solostar Pen Inject 16 Units into the skin daily. 15 mL 1   metFORMIN (GLUCOPHAGE) 500 MG tablet Take 2 tablets (1,000 mg total) by mouth daily with breakfast. 180 tablet 1   No facility-administered medications prior to visit.    No Known Allergies     Objective:    BP  108/72    Pulse 73    Ht _0  (1.727 m)    Wt 190 lb 8 oz (86.4 kg)    LMP 08/09/2018 (Approximate)    SpO2 100%    BMI 28.97 kg/m  Wt Readings from Last 3 Encounters:  12/04/21 190 lb 8 oz (86.4 kg)  09/16/21 190 lb 4 oz (86.3 kg)  08/03/21 187 lb 4 oz (84.9 kg)    Physical Exam Vitals and nursing note reviewed.  Constitutional:      Appearance: She is well-developed.  HENT:     Head: Normocephalic and atraumatic.  Cardiovascular:     Rate and Rhythm: Normal rate and regular rhythm.     Heart sounds: Normal heart sounds. No murmur heard.   No friction rub. No gallop.  Pulmonary:     Effort: Pulmonary effort is normal. No tachypnea or  respiratory distress.     Breath sounds: Normal breath sounds. No decreased breath sounds, wheezing, rhonchi or rales.  Chest:     Chest wall: No tenderness.  Abdominal:     General: Bowel sounds are normal.     Palpations: Abdomen is soft.  Musculoskeletal:        General: Normal range of motion.     Cervical back: Normal range of motion.  Skin:    General: Skin is warm and dry.  Neurological:     Mental Status: She is alert and oriented to person, place, and time.     Coordination: Coordination normal.  Psychiatric:        Behavior: Behavior normal. Behavior is cooperative.        Thought Content: Thought content normal.        Judgment: Judgment normal.         Patient has been counseled extensively about nutrition and exercise as well as the importance of adherence with medications and regular follow-up. The patient was given clear instructions to go to ER or return to medical center if symptoms don't improve, worsen or new problems develop. The patient verbalized understanding.   Follow-up: Return in about 6 months (around 06/03/2022).   Gildardo Pounds, FNP-BC [facility] and [facility] [place], [place]   12/04/2021, 10:08 AM

## 2021-12-04 NOTE — Progress Notes (Signed)
Psychiatric Initial Adult Assessment   Patient Identification: Roberta Jenkins MRN:  [id number] Date of Evaluation:  12/04/2021 Referral Source: Referred by LCSW Chief Complaint:   Chief Complaint   Medication Management    Visit Diagnosis:    ICD-10-CM   1. Adjustment disorder with depressed mood  F43.21 sertraline (ZOLOFT) 50 MG tablet    2. Anxiety state  F41.1 hydrOXYzine (ATARAX) 10 MG tablet      History of Present Illness:    Roberta Jenkins is a 61 year old female with a past psychiatric history significant for adjustment disorder with depressed mood and anxiety who presents to [facility] for medication management.  Patient reports that she is currently struggling with depression and instances of forgetfulness.  She reports that her depression comes and goes but states that she initially developed depression due to having to leave her place of employee due to her health issues.  Since becoming unemployed, patient states that life has not been the same for her.  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  Patient states that she experiences depression most days.  One of her big contributors to her depression is her self isolation.  She reports not being around as many friends as she used to be.  Patient endorses the following depressive symptoms: low mood, lack of energy, and lack of motivation.  Patient states that she has also been trying to lose weight due to her health issues.  Patient endorses anxiety and rates her anxiety as 6 out of 10.  Stressors to her anxiety include trying to do too many activities on time as well as trying to please everyone.  Patient denies a past history of hospitalization due to mental health.  She reports that she attempted suicide several years ago but does not remember the reason why.  During her attempt, patient believes that  she tried to overdose due to things not going her way.  PHQ-9 screen was performed with the patient scoring a 14.  A GAD-7 screen was also performed with the patient scoring a 6.  Patient is alert and oriented x4, calm, cooperative, and fully engaged in conversation during the encounter.  Patient denies suicidal or homicidal ideations.  She further denies auditory or visual hallucinations and does not appear to be responding to internal/external stimuli.  Patient endorses fair sleep and receives on average 4 to 5 hours of sleep each night.  Patient states that last week she experienced 3 nightmares in 1 week.  Patient endorses good appetite and eats on average 3 meals a day.  Patient denies alcohol consumption, tobacco use, and illicit drug use.  Associated Signs/Symptoms: Depression Symptoms:  depressed mood, psychomotor agitation, psychomotor retardation, fatigue, feelings of worthlessness/guilt, difficulty concentrating, impaired memory, anxiety, panic attacks, loss of energy/fatigue, disturbed sleep, (Hypo) Manic Symptoms:  Distractibility, Flight of Ideas, Community education officer, Grandiosity, Labiality of Mood, Anxiety Symptoms:  Excessive Worry, Panic Symptoms, Obsessive Compulsive Symptoms:   Patient states that things must be in order, Social Anxiety, Specific Phobias, Psychotic Symptoms:  Paranoia, PTSD Symptoms: Had a traumatic exposure:  Patient states that her niece and nephew were killed Had a traumatic exposure in the last month:  n/a Re-experiencing:  Flashbacks Intrusive Thoughts Nightmares Hypervigilance:  No Hyperarousal:  Difficulty Concentrating Emotional Numbness/Detachment Increased Startle Response Sleep Avoidance:  Foreshortened Future  Past Psychiatric History:  Adjustment disorder with depressed mood Anxiety  Previous Psychotropic Medications: No   Substance Abuse History in the last 12 months:  No.  Consequences of Substance  Abuse: Negative  Past Medical History:  Past Medical History:  Diagnosis Date   Back pain    Diabetes mellitus ([place])    Glaucoma    Hyperlipidemia    Menorrhagia 12/17/2011   No pertinent past medical history     Past Surgical History:  Procedure Laterality Date   CESAREAN SECTION  1981, 1989   2x   MULTIPLE TOOTH EXTRACTIONS      Family Psychiatric History:    Family History:  Family History  Problem Relation Age of Onset   Cancer Mother        colon   Cancer Father    Kidney disease Sister    Hypertension Brother     Social History:   Social History   Socioeconomic History   Marital status: Divorced    Spouse name: Not on file   Number of children: Not on file   Years of education: Not on file   Highest education level: Not on file  Occupational History   Not on file  Tobacco Use   Smoking status: Never   Smokeless tobacco: Never  Vaping Use   Vaping Use: Never used  Substance and Sexual Activity   Alcohol use: No   Drug use: No   Sexual activity: Never    Birth control/protection: Abstinence    Comment: last intercourse was 5 yrs ago  Other Topics Concern   Not on file  Social History Narrative   Not on file   Social Determinants of Health   Financial Resource Strain: Not on file  Food Insecurity: Not on file  Transportation Needs: Not on file  Physical Activity: Not on file  Stress: Not on file  Social Connections: Not on file    Additional Social History:  Patient is currently unemployed.  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  She reports that she had to leave her position after she had issues with her health.  Patient endorses social support.  Patient endorses housing and lives in an apartment.  Allergies:  No Known Allergies  Metabolic Disorder Labs: Lab Results  Component Value Date   HGBA1C 5.7 (A) 12/04/2021   No results found for: PROLACTIN Lab Results  Component Value Date   CHOL 241 (H)  08/03/2021   TRIG 135 08/03/2021   HDL 57 08/03/2021   CHOLHDL 4.2 08/03/2021   LDLCALC 160 (H) 08/03/2021   LDLCALC 211 (H) 01/29/2021   No results found for: TSH  Therapeutic Level Labs: No results found for: LITHIUM No results found for: CBMZ No results found for: VALPROATE  Current Medications: Current Outpatient Medications  Medication Sig Dispense Refill   hydrOXYzine (ATARAX) 10 MG tablet Take 1 tablet (10 mg total) by mouth 3 (three) times daily as needed. 75 tablet 1   sertraline (ZOLOFT) 50 MG tablet Take 1 tablet (50 mg total) by mouth daily. 30 tablet 1   Accu-Chek Softclix Lancets lancets Use as instructed. Inject into the skin twice daily 100 each 3   atorvastatin (LIPITOR) 20 MG tablet Take 1 tablet (20 mg total) by mouth daily. 90 tablet 1   glucose blood (ACCU-CHEK GUIDE) test strip Use as instructed. Check blood glucose by fingerstick twice per day. 100 each 12   insulin glargine (LANTUS SOLOSTAR) 100 UNIT/ML Solostar Pen Inject 16 Units into the skin daily. 15 mL 1  Insulin Pen Needle (TRUEPLUS 5-BEVEL PEN NEEDLES) 32G X 4 MM MISC Use to inject Basaglar once daily. E11.65 100 each 2   Lancets Misc. (ACCU-CHEK SOFTCLIX LANCET DEV) KIT Use as instructed. Check blood glucose level by fingerstick TWICE per day. 1 kit 0   latanoprost (XALATAN) 0.005 % ophthalmic solution SMARTSIG:1 Drop(s) In Eye(s) Every Evening     metFORMIN (GLUCOPHAGE) 500 MG tablet Take 2 tablets (1,000 mg total) by mouth daily with breakfast. 180 tablet 1   Multiple Vitamin (MULTIVITAMIN WITH MINERALS) TABS tablet Take 1 tablet by mouth daily.     traMADol (ULTRAM) 50 MG tablet Take 1 tablet (50 mg total) by mouth every 12 (twelve) hours as needed. 40 tablet 0   Travoprost, BAK Free, (TRAVATAN) 0.004 % SOLN ophthalmic solution Place 1 drop into both eyes at bedtime. Samples     No current facility-administered medications for this visit.    Musculoskeletal: Strength & Muscle Tone: within normal  limits Gait & Station: normal Patient leans: N/A  Psychiatric Specialty Exam: Review of Systems  Psychiatric/Behavioral:  Positive for decreased concentration and sleep disturbance. Negative for dysphoric mood, hallucinations, self-injury and suicidal ideas. The patient is nervous/anxious. The patient is not hyperactive.    Blood pressure 106/77, pulse 63, temperature 98.7 F (37.1 C), height $RemoveBe'5\' 8"'LnOcMPOet$  (1.727 m), weight 188 lb (85.3 kg), last menstrual period 08/09/2018, SpO2 100 %.Body mass index is 28.59 kg/m.  General Appearance: Casual  Eye Contact:  Good  Speech:  Clear and Coherent and Normal Rate  Volume:  Normal  Mood:  Anxious and Depressed  Affect:  Congruent and Depressed  Thought Process:  Coherent, Goal Directed, and Descriptions of Associations: Intact  Orientation:  Full (Time, Place, and Person)  Thought Content:  WDL  Suicidal Thoughts:  No  Homicidal Thoughts:  No  Memory:  Immediate;   Good Recent;   Fair Remote;   Fair  Judgement:  Fair  Insight:  Fair  Psychomotor Activity:  Normal  Concentration:  Concentration: Good and Attention Span: Good  Recall:  [place]: Good  Akathisia:  No  Handed:  Right  AIMS (if indicated):  not done  Assets:  Communication Skills Desire for Improvement Housing Social Support  ADL's:  Intact  Cognition: WNL  Sleep:  Fair   Screenings: GAD-7    Personnel officer Visit from 12/04/2021 in [place] Office Visit from 09/16/2021 in [place] Office Visit from 08/03/2021 in [place] Office Visit from 05/01/2021 in [place] Office Visit from 01/29/2021 in [place]  Total GAD-7 Score $RemoveBef'6 5 7 13 11      'Hiaxjhbcbh$ PHQ2-9    [place] Office Visit from 12/04/2021 in [place] [facility] from 10/14/2021 in [facility] Office Visit from 09/16/2021 in [place] [facility] from 08/10/2021 in [facility] Office Visit from 08/03/2021 in [place]  PHQ-2 Total Score 6 3 0 3 1  PHQ-9 Total Score $RemoveBef'14 11 5 'GZGrCjQCHg$ -- 7      [place] Office Visit from 12/04/2021 in [facility] from 08/10/2021 in [facility] ED from 03/20/2021 in [place] Risk Error: Question 6 not populated Error:  Question 6 not populated       Assessment and Plan:   Roberta Jenkins is a 61 year old female with a past psychiatric history significant for adjustment disorder with depressed mood and anxiety who presents to [facility] for medication management.  Patient presents to the clinic due to depressed mood after losing her job due to her health issues and anxiety.  Patient denies using psychiatric medications in the past.  Patient was recommended Zoloft 50 mg daily for the management of her depressive symptoms and anxiety.  Patient was also recommended hydroxyzine 10 mg 3 times daily as needed for the management of her anxiety.  Patient was agreeable to recommendation.  Patient's medications to be e-prescribed to pharmacy of choice.  1. Adjustment disorder with depressed mood  - sertraline (ZOLOFT) 50 MG tablet; Take 1 tablet (50 mg total) by mouth daily.  Dispense: 30 tablet; Refill: 1  2. Anxiety state  - hydrOXYzine (ATARAX) 10 MG tablet; Take 1 tablet (10 mg total) by mouth 3 (three) times daily as needed.  Dispense: 75 tablet; Refill: 1  Patient to follow up in 6 weeks Provider spent a total of 30 minutes with the patient/reviewing the patient's chart.  Malachy Mood, PA 2/3/20233:23 PM

## 2021-12-05 LAB — CMP14+EGFR
ALT: 19 IU/L (ref 0–32)
AST: 14 IU/L (ref 0–40)
Albumin/Globulin Ratio: 2 (ref 1.2–2.2)
Albumin: 4.5 g/dL (ref 3.8–4.8)
Alkaline Phosphatase: 70 IU/L (ref 44–121)
BUN/Creatinine Ratio: 12 (ref 12–28)
BUN: 9 mg/dL (ref 8–27)
Bilirubin Total: 0.3 mg/dL (ref 0.0–1.2)
CO2: 22 mmol/L (ref 20–29)
Calcium: 9.8 mg/dL (ref 8.7–10.3)
Chloride: 103 mmol/L (ref 96–106)
Creatinine, Ser: 0.76 mg/dL (ref 0.57–1.00)
Globulin, Total: 2.3 g/dL (ref 1.5–4.5)
Glucose: 88 mg/dL (ref 70–99)
Potassium: 4.6 mmol/L (ref 3.5–5.2)
Sodium: 140 mmol/L (ref 134–144)
Total Protein: 6.8 g/dL (ref 6.0–8.5)
eGFR: 89 mL/min/{1.73_m2} (ref >59)

## 2021-12-07 ENCOUNTER — Telehealth: Payer: Self-pay

## 2021-12-07 NOTE — Telephone Encounter (Signed)
-----   Message from Gildardo Pounds, NP sent at 12/06/2021  8:47 AM EST ----- Kidney, liver function and electrolytes are normal.

## 2021-12-07 NOTE — Telephone Encounter (Signed)
Called pt phone rung but not vm to leave a message.

## 2021-12-17 ENCOUNTER — Ambulatory Visit: Payer: Self-pay

## 2021-12-17 ENCOUNTER — Encounter: Payer: Self-pay | Admitting: Orthopaedic Surgery

## 2021-12-17 ENCOUNTER — Other Ambulatory Visit: Payer: Self-pay

## 2021-12-17 ENCOUNTER — Ambulatory Visit (INDEPENDENT_AMBULATORY_CARE_PROVIDER_SITE_OTHER): Payer: Medicaid Other | Admitting: Orthopaedic Surgery

## 2021-12-17 ENCOUNTER — Ambulatory Visit (INDEPENDENT_AMBULATORY_CARE_PROVIDER_SITE_OTHER): Payer: Medicaid Other

## 2021-12-17 VITALS — Ht 68.0 in | Wt 188.0 lb

## 2021-12-17 DIAGNOSIS — M1712 Unilateral primary osteoarthritis, left knee: Secondary | ICD-10-CM

## 2021-12-17 DIAGNOSIS — M1612 Unilateral primary osteoarthritis, left hip: Secondary | ICD-10-CM | POA: Diagnosis not present

## 2021-12-17 MED ORDER — TRAMADOL HCL 50 MG PO TABS: 50.0000 mg | ORAL_TABLET | Freq: Two times a day (BID) | ORAL | 2 refills | Status: AC | PRN

## 2021-12-17 NOTE — Progress Notes (Signed)
Office Visit Note   Patient: Roberta Jenkins           Date of Birth: Aug 06, 1961           MRN: [id number] Visit Date: 12/17/2021              Requested by: Gildardo Pounds, NP [address],  [place] [address] PCP: Gildardo Pounds, NP   Assessment & Plan: Visit Diagnoses:  1. Primary osteoarthritis of left knee   2. Primary osteoarthritis of left hip     Plan: Impression is left hip osteoarthritis in addition to left knee osteoarthritis.  At this point we discussed proceeding with intra-articular hip injection to see if this would help with the hip pain and knee pain, however she would like to go ahead and proceed with the knee injection first.  We will also make a referral to Dr. Ernestina Patches for the hip injection.  She understands that if the knee injection does not relieve her symptoms, that this pain could be coming from her hip.  She will follow-up with [place] as needed.  Follow-Up Instructions: Return if symptoms worsen or fail to improve.   Orders:  Orders Placed This Encounter  Procedures   Large Joint Inj: L knee   XR HIP UNILAT W OR W/O PELVIS 2-3 VIEWS LEFT   XR KNEE 3 VIEW LEFT   Ambulatory referral to Physical Medicine Rehab   Meds ordered this encounter  Medications   traMADol (ULTRAM) 50 MG tablet    Sig: Take 1 tablet (50 mg total) by mouth every 12 (twelve) hours as needed.    Dispense:  60 tablet    Refill:  2      Procedures: Large Joint Inj: L knee on 12/17/2021 8:47 AM Indications: pain Details: 22 G needle, anterolateral approach Medications: 2 mL lidocaine 1 %; 2 mL bupivacaine 0.25 %; 40 mg methylPREDNISolone acetate 40 MG/ML     Clinical Data: No additional findings.   Subjective: Chief Complaint  Patient presents with   Right Knee - Pain    HPI patient is a pleasant 61 year old female who comes in today with left hip and left knee pain for the past week.  The pain in the hip is to the groin and anterior thigh but she is also  complaining of pain to the left knee.  Pain is worse with walking as well as going from a seated to standing position.  She does get some relief with rest.  She does not take any medication for this.  She does note occasional paresthesias to the thigh.  She does have a history of right knee OA which has been injected with cortisone in the past with good relief.  Review of Systems as detailed in HPI.  All others reviewed and are negative.   Objective: Vital Signs: Ht 5\' 8"  (1.727 m)    Wt 188 lb (85.3 kg)    LMP 08/09/2018 (Approximate)    BMI 28.59 kg/m   Physical Exam well-developed well-nourished female no acute distress.  Alert and oriented x3.  Ortho Exam left hip exam shows a positive logroll and FADIR.  No focal weakness.  Left knee exam shows medial and lateral joint line tenderness.  Moderate patellofemoral crepitus.  Range of motion 0 to 110 degrees.  She is neurovascular intact distally.  Specialty Comments:  No specialty comments available.  Imaging: No results found.   PMFS History: Patient Active Problem List   Diagnosis Date  Noted   Type 2 diabetes mellitus with hyperglycemia ([place]) 12/19/2019   Fibroid 09/04/2015   Menorrhagia 12/17/2011   Past Medical History:  Diagnosis Date   Back pain    Diabetes mellitus ([place])    Glaucoma    Hyperlipidemia    Menorrhagia 12/17/2011   No pertinent past medical history     Family History  Problem Relation Age of Onset   Cancer Mother        colon   Cancer Father    Kidney disease Sister    Hypertension Brother     Past Surgical History:  Procedure Laterality Date   CESAREAN SECTION  1981, 1989   2x   MULTIPLE TOOTH EXTRACTIONS     Social History   Occupational History   Not on file  Tobacco Use   Smoking status: Never   Smokeless tobacco: Never  Vaping Use   Vaping Use: Never used  Substance and Sexual Activity   Alcohol use: No   Drug use: No   Sexual activity: Never    Birth control/protection:  Abstinence    Comment: last intercourse was 5 yrs ago

## 2021-12-18 MED ORDER — LIDOCAINE HCL 1 % IJ SOLN
2.0000 mL | INTRAMUSCULAR | Status: AC | PRN
  Administered 2021-12-17: 2 mL

## 2021-12-18 MED ORDER — METHYLPREDNISOLONE ACETATE 40 MG/ML IJ SUSP
40.0000 mg | INTRAMUSCULAR | Status: AC | PRN
  Administered 2021-12-17: 40 mg via INTRA_ARTICULAR

## 2021-12-18 MED ORDER — BUPIVACAINE HCL 0.25 % IJ SOLN
2.0000 mL | INTRAMUSCULAR | Status: AC | PRN
  Administered 2021-12-17: 2 mL via INTRA_ARTICULAR

## 2021-12-30 ENCOUNTER — Ambulatory Visit (INDEPENDENT_AMBULATORY_CARE_PROVIDER_SITE_OTHER): Payer: Medicaid Other | Admitting: Physical Medicine and Rehabilitation

## 2021-12-30 ENCOUNTER — Ambulatory Visit: Payer: Self-pay

## 2021-12-30 ENCOUNTER — Encounter: Payer: Self-pay | Admitting: Physical Medicine and Rehabilitation

## 2021-12-30 ENCOUNTER — Other Ambulatory Visit: Payer: Self-pay

## 2021-12-30 DIAGNOSIS — M25552 Pain in left hip: Secondary | ICD-10-CM | POA: Diagnosis not present

## 2021-12-30 NOTE — Progress Notes (Signed)
? ?  Roberta Jenkins - 61 y.o. female MRN [id number]  Date of birth: 10/06/1961 ? ?Office Visit Note: ?Visit Date: 12/30/2021 ?PCP: Gildardo Pounds, NP ?Referred by: Gildardo Pounds, NP ? ?Subjective: ?Chief Complaint  ?Patient presents with  ? Left Hip - Pain  ? ?HPI:  Roberta Jenkins is a 61 y.o. female who comes in today at the request of Dr. Eduard Roux for planned Left anesthetic hip arthrogram with fluoroscopic guidance.  The patient has failed conservative care including home exercise, medications, time and activity modification.  This injection will be diagnostic and hopefully therapeutic.  Please see requesting physician notes for further details and justification. ? ?ROS Otherwise per HPI. ? ?Assessment & Plan: ?Visit Diagnoses:  ?  ICD-10-CM   ?1. Pain in left hip  M25.552 XR C-ARM NO REPORT  ?  Large Joint Inj: L hip joint  ?  ?  ?Plan: No additional findings.  ? ?Meds & Orders: No orders of the defined types were placed in this encounter. ?  ?Orders Placed This Encounter  ?Procedures  ? Large Joint Inj: L hip joint  ? XR C-ARM NO REPORT  ?  ?Follow-up: Return for visit to requesting provider as needed.  ? ?Procedures: ?Large Joint Inj: L hip joint on 12/30/2021 1:53 PM ?Indications: diagnostic evaluation and pain ?Details: 22 G 3.5 in needle, fluoroscopy-guided anterior approach ? ?Arthrogram: No ? ?Medications: 4 mL bupivacaine 0.25 %; 60 mg triamcinolone acetonide 40 MG/ML ?Outcome: tolerated well, no immediate complications ? ?There was excellent flow of contrast producing a partial arthrogram of the hip. The patient did have relief of symptoms during the anesthetic phase of the injection. ?Procedure, treatment alternatives, risks and benefits explained, specific risks discussed. Consent was given by the patient. Immediately prior to procedure a time out was called to verify the correct patient, procedure, equipment, support staff and site/side marked as required. Patient was prepped and draped  in the usual sterile fashion.  ? ?  ?   ? ?Clinical History: ?No specialty comments available.  ? ? ? ?Objective:  VS:  HT:    WT:   BMI:     BP:   HR: bpm  TEMP: ( )  RESP:  ?Physical Exam  ? ?Imaging: ?No results found. ?

## 2021-12-30 NOTE — Progress Notes (Signed)
Pt state left hip pain. Pt state climbing up and downs stairs makes the pain worse. Pt state she takes pain meds to help ease her pain.  ? ?Numeric Pain Rating Scale and Functional Assessment ?Average Pain 7 ? ? ?In the last MONTH (on 0-10 scale) has pain interfered with the following? ? ?1. General activity like being  able to carry out your everyday physical activities such as walking, climbing stairs, carrying groceries, or moving a chair?  ?Rating(10) ? ? ?-BT, -Dye Allergies. ? ?

## 2022-01-04 ENCOUNTER — Ambulatory Visit (HOSPITAL_COMMUNITY): Payer: Medicaid Other | Admitting: Licensed Clinical Social Worker

## 2022-01-10 MED ORDER — BUPIVACAINE HCL 0.25 % IJ SOLN
4.0000 mL | INTRAMUSCULAR | Status: AC | PRN
  Administered 2021-12-30: 4 mL via INTRA_ARTICULAR

## 2022-01-10 MED ORDER — TRIAMCINOLONE ACETONIDE 40 MG/ML IJ SUSP
60.0000 mg | INTRAMUSCULAR | Status: AC | PRN
  Administered 2021-12-30: 60 mg via INTRA_ARTICULAR

## 2022-01-22 ENCOUNTER — Encounter (HOSPITAL_COMMUNITY): Payer: Medicaid Other | Admitting: Physician Assistant

## 2022-03-06 IMAGING — CT CT ABD-PELV W/ CM
2 of 5 series · 15 of 46 positions shown, 17 images · IV contrast (APPLIED)
Comparison: Pelvic ultrasound-09/10/2014

CLINICAL DATA: Bright red rectal bleeding since last evening.
Concern for diverticulitis.

EXAM:
CT ABDOMEN AND PELVIS WITH CONTRAST
TECHNIQUE: Multidetector CT imaging of the abdomen and pelvis was performed
using the standard protocol following bolus administration of
intravenous contrast.
CONTRAST:  100mL OMNIPAQUE IOHEXOL 300 MG/ML  SOLN

[Series 3: abd/ pelvis 5.0 i30f 2 · axial · 0.89mm/px · z∈[+786,+1211]mm · 12 of 96 slices shown, 14 images]
[im 6/96  soft-tissue]
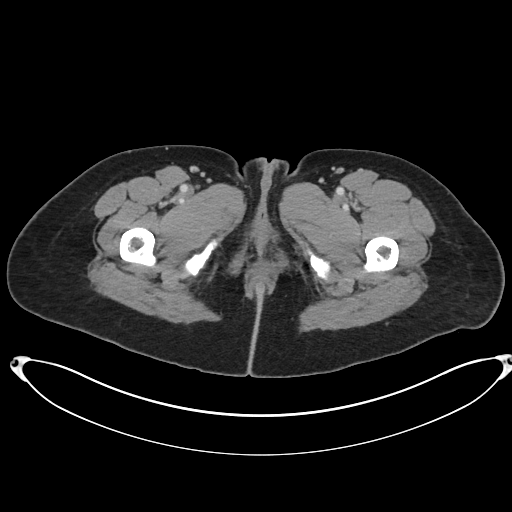
[im 6/96  bone]
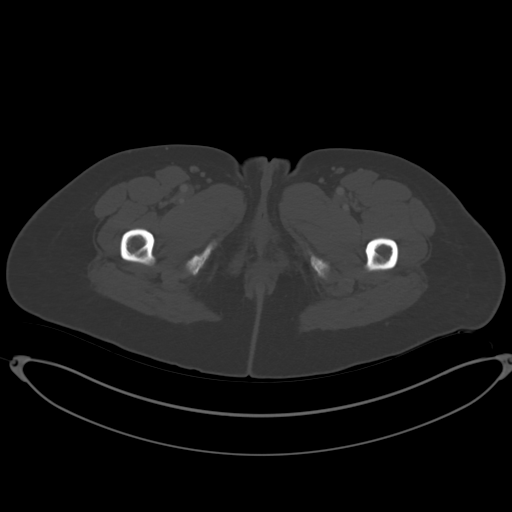
[im 16/96  soft-tissue]
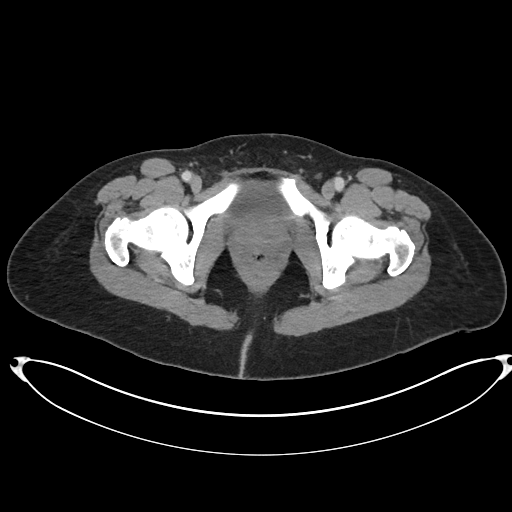
[im 21/96  soft-tissue]
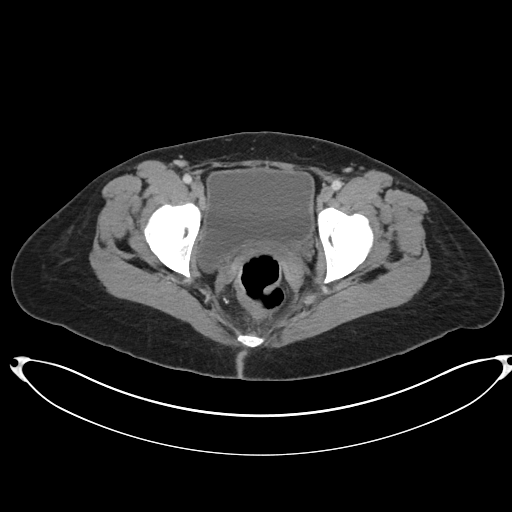
[im 31/96  soft-tissue]
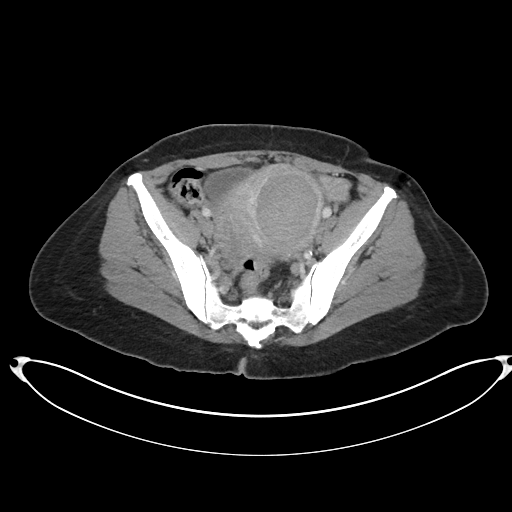
[im 36/96  soft-tissue]
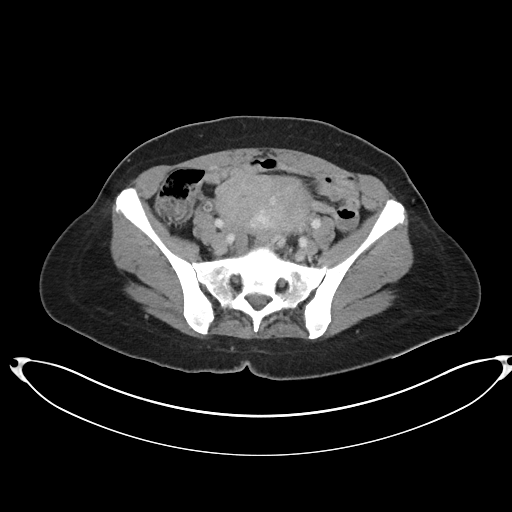
[im 46/96  soft-tissue]
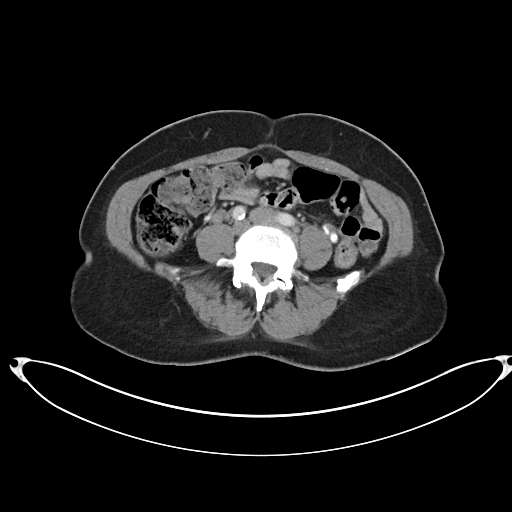
[im 51/96  soft-tissue]
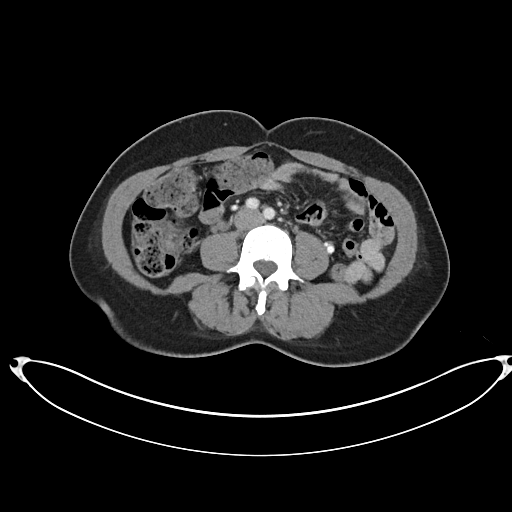
[im 61/96  soft-tissue]
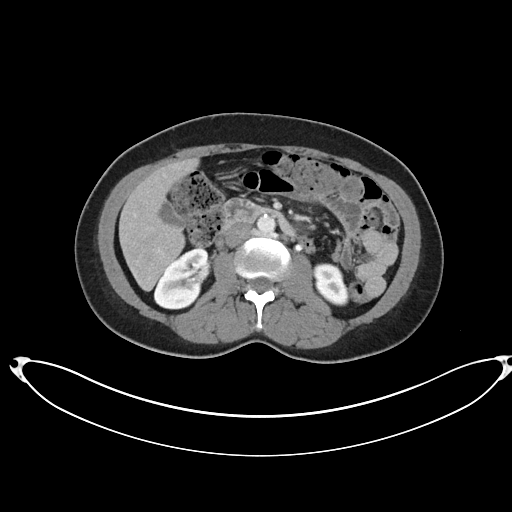
[im 66/96  soft-tissue]
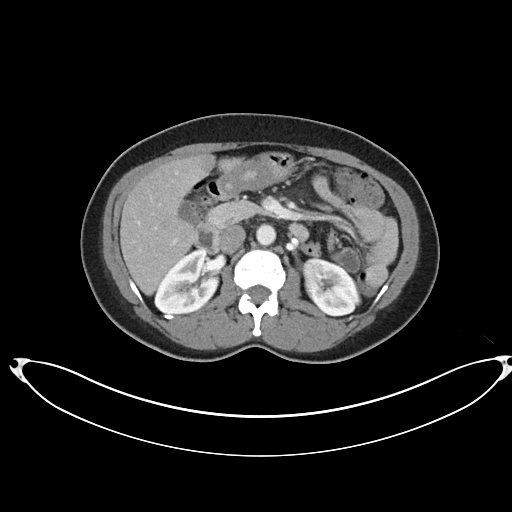
[im 66/96  bone]
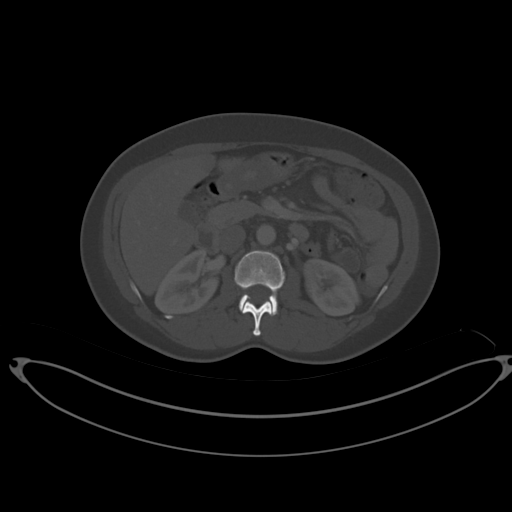
[im 76/96  soft-tissue]
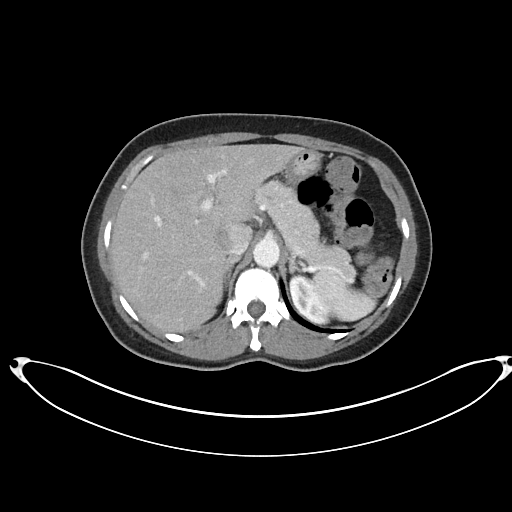
[im 81/96  soft-tissue]
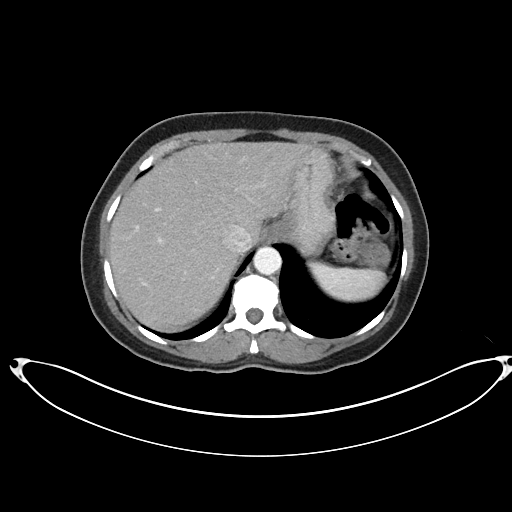
[im 91/96  soft-tissue]
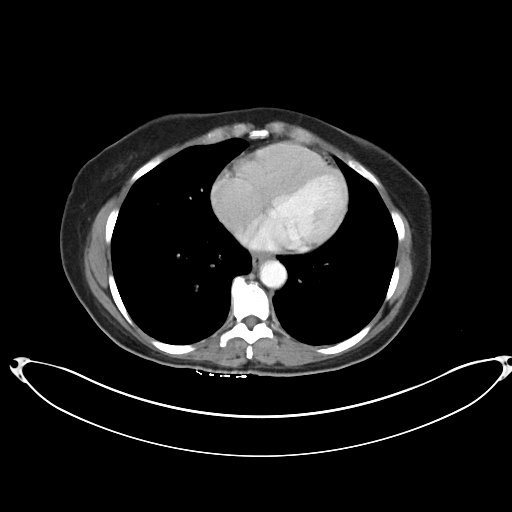

[Series 6: coronal soft tissue · coronal · 0.87mm/px · 3 of 93 slices shown]
[im 31/93  soft-tissue]
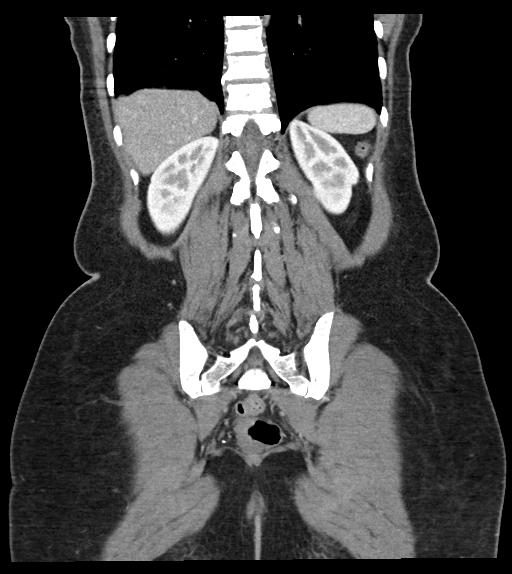
[im 41/93  soft-tissue]
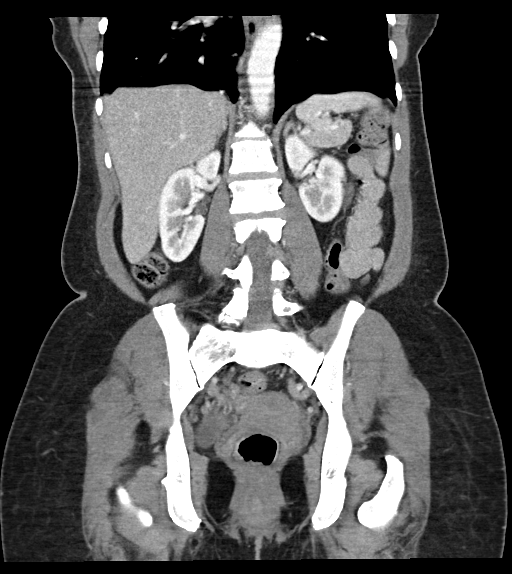
[im 52/93  soft-tissue]
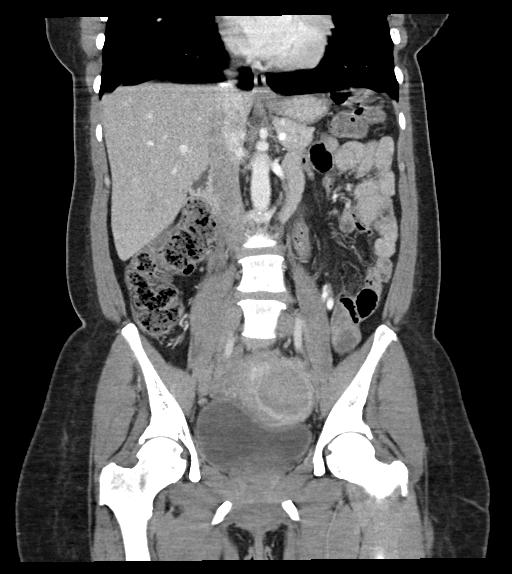

[15 of 46 positions shown; findings below may reference images not displayed]

FINDINGS: Lower chest: Limited visualization of lower thorax is degraded
secondary to patient respiratory artifact. Minimal dependent
subpleural ground-glass atelectasis. No discrete focal airspace
opacities. No pleural effusion.

Normal heart size.  No pericardial effusion.

Hepatobiliary: Normal hepatic contour. There is a minimal amount of
focal fatty infiltration adjacent to the fissure for the ligamentum
teres. No discrete worrisome hepatic lesions. Normal appearance of
the gallbladder given underdistention. No radiopaque gallstones. No
intra or extrahepatic biliary ductal dilatation. No ascites.

Pancreas: Normal appearance of the pancreas.

Spleen: Normal appearance of the spleen.

Adrenals/Urinary Tract: There is symmetric enhancement and excretion
of the bilateral kidneys. No evidence of nephrolithiasis on this
postcontrast examination. No discrete renal lesions. No urine
obstruction or perinephric stranding.

Mild thickening of the left adrenal gland without discrete no
nodule. Normal appearance of the right adrenal gland. Normal
appearance of the urinary bladder given degree of distention.

Stomach/Bowel: Potential circumferential wall thickening involving
the gastric antrum (axial image 31, series 3; axial image 30, series
6), potentially accentuated due to underdistention.

Otherwise, no discrete areas of bowel wall thickening. No discrete
areas of intraluminal contrast extravasation on this non multiphase
examination. Large colonic stool burden without evidence of enteric
obstruction. Normal noncontrast appearance of the terminal ileum and
the diminutive retrocecal appendix. No pneumoperitoneum, pneumatosis
or portal venous gas.

Vascular/Lymphatic: Minimal amount of mixed calcified and
noncalcified atherosclerotic plaque within normal caliber abdominal
aorta, not resulting in a hemodynamically significant stenosis. The
major branch vessels of the abdominal aorta appear patent on this
non CTA examination.

Note is made of a circumaortic left renal vein. Mild hypertrophy of
the left gonadal vein which supplies several mildly hypertrophied
left adnexal venous collaterals.

No bulky retroperitoneal, mesenteric, pelvic or inguinal
lymphadenopathy.

Reproductive: Note is made of an approximately 6.6 x 6.2 x 5.6 cm
fibroid within the left side of the fundus of the uterus (axial
image 63, series 3; sagittal coronal image 38, series 6), similar to
remote pelvic ultrasound performed [DATE], previously, 8.9 cm. No
discrete adnexal lesion. No free fluid within the pelvic cul-de-sac.

Other: Tiny mesenteric fat containing periumbilical hernia.

Musculoskeletal: No acute or aggressive osseous abnormalities.
Stigmata of dish within the lower thoracic spine. There is partial
ossification of the T11-T12 vertebral body. Moderate bilateral facet
degenerative change of the lower lumbar spine. Mild-to-moderate
degenerative change the bilateral hips with joint space loss,
subchondral sclerosis and osteophytosis.
IMPRESSION: 1. Mild apparent circumferential wall thickening involving the
gastric antrum, potentially artifactual due to underdistention.
Clinical correlation is advised. Further evaluation with endoscopy
could be performed as clinically indicated.
2. Otherwise, no acute findings within the abdomen or pelvis.
3. Large colonic stool burden without evidence of enteric
obstruction.
4. Note made of an at least 6.6 cm uterine fibroid, similar to
remote pelvic ultrasound performed September 2014.  Aortic Atherosclerosis ([id number]).

## 2022-04-02 ENCOUNTER — Other Ambulatory Visit: Payer: Self-pay | Admitting: Nurse Practitioner

## 2022-04-02 DIAGNOSIS — Z1231 Encounter for screening mammogram for malignant neoplasm of breast: Secondary | ICD-10-CM

## 2022-04-12 ENCOUNTER — Ambulatory Visit (INDEPENDENT_AMBULATORY_CARE_PROVIDER_SITE_OTHER): Payer: Medicaid Other | Admitting: Physician Assistant

## 2022-04-12 ENCOUNTER — Ambulatory Visit (INDEPENDENT_AMBULATORY_CARE_PROVIDER_SITE_OTHER): Payer: Medicaid Other

## 2022-04-12 DIAGNOSIS — M79604 Pain in right leg: Secondary | ICD-10-CM

## 2022-04-12 MED ORDER — METHYLPREDNISOLONE 4 MG PO TBPK: ORAL_TABLET | ORAL | 0 refills | Status: AC

## 2022-04-12 NOTE — Progress Notes (Signed)
Office Visit Note   Patient: Roberta Jenkins           Date of Birth: Nov 07, 1960           MRN: [id number] Visit Date: 04/12/2022              Requested by: Gildardo Pounds, NP [address],  [place] [address] PCP: Gildardo Pounds, NP   Assessment & Plan: Visit Diagnoses:  1. Pain in right leg     Plan: Impression is chronic right leg pain possibly referred from her lumbar spine.  I would like to try her on a Medrol Dosepak and provide her with spine conditioning exercises.  If her symptoms do not improve over the next few weeks I would like for her to reach out to her primary care provider to make sure her symptoms are not from varicose veins.  She will follow-up with [place] as needed.  Follow-Up Instructions: Return if symptoms worsen or fail to improve.   Orders:  Orders Placed This Encounter  Procedures   XR Lumbar Spine 2-3 Views   Meds ordered this encounter  Medications   methylPREDNISolone (MEDROL DOSEPAK) 4 MG TBPK tablet    Sig: Take as directed    Dispense:  21 tablet    Refill:  0      Procedures: No procedures performed   Clinical Data: No additional findings.   Subjective: Chief Complaint  Patient presents with   Left Leg - Pain   Right Leg - Pain    HPI patient is a pleasant 61 year old female who comes in today with right leg pain for the past few months.  She denies any injury or change in activity.  She has pain that radiates from the anteromedial thigh down to the foot.  She denies any back pain.  She does have occasional groin pain.  Symptoms appear to be worse with lots of standing or walking as well as at night when she is trying to sleep.  She has changed her shoes which have slightly helped.  She has tried compression socks which provide no relief.  She does not take medication for this.  She does note occasional numbness to the right foot.  Of note, she was referred to Dr. Ernestina Patches in early March of this year for left  groin pain.  Intra-articular cortisone injection to the left hip was performed which has provided good relief.  She feels this pain is very different.  Review of Systems as detailed in HPI.  All others reviewed and are negative.   Objective: Vital Signs: LMP 08/09/2018 (Approximate)   Physical Exam well-developed well-nourished female no acute distress.  Alert and oriented x3.  Ortho Exam right hip exam shows a painless logroll.  Painless FADIR.  Positive straight leg raise.  No pain with lumbar flexion or extension.  Patient does have an area of varicose veins to the medial thigh which is somewhat tender.  Calf is soft nontender.  No focal weakness.  She is neurovascular intact distally.  Specialty Comments:  No specialty comments available.  Imaging: No results found.   PMFS History: Patient Active Problem List   Diagnosis Date Noted   Type 2 diabetes mellitus with hyperglycemia ([place]) 12/19/2019   Fibroid 09/04/2015   Menorrhagia 12/17/2011   Past Medical History:  Diagnosis Date   Back pain    Diabetes mellitus ([place])    Glaucoma    Hyperlipidemia    Menorrhagia 12/17/2011  No pertinent past medical history     Family History  Problem Relation Age of Onset   Cancer Mother        colon   Cancer Father    Kidney disease Sister    Hypertension Brother     Past Surgical History:  Procedure Laterality Date   CESAREAN SECTION  1981, 1989   2x   MULTIPLE TOOTH EXTRACTIONS     Social History   Occupational History   Not on file  Tobacco Use   Smoking status: Never   Smokeless tobacco: Never  Vaping Use   Vaping Use: Never used  Substance and Sexual Activity   Alcohol use: No   Drug use: No   Sexual activity: Never    Birth control/protection: Abstinence    Comment: last intercourse was 5 yrs ago

## 2022-04-13 ENCOUNTER — Ambulatory Visit: Payer: Medicaid Other | Attending: Nurse Practitioner | Admitting: Nurse Practitioner

## 2022-04-13 ENCOUNTER — Encounter: Payer: Self-pay | Admitting: Nurse Practitioner

## 2022-04-13 DIAGNOSIS — Z794 Long term (current) use of insulin: Secondary | ICD-10-CM

## 2022-04-13 DIAGNOSIS — E1165 Type 2 diabetes mellitus with hyperglycemia: Secondary | ICD-10-CM

## 2022-04-13 DIAGNOSIS — E785 Hyperlipidemia, unspecified: Secondary | ICD-10-CM | POA: Diagnosis not present

## 2022-04-13 MED ORDER — METFORMIN HCL 500 MG PO TABS: 1000.0000 mg | ORAL_TABLET | Freq: Every day | ORAL | 1 refills | Status: AC

## 2022-04-13 MED ORDER — ACCU-CHEK GUIDE VI STRP: ORAL_STRIP | 12 refills | Status: AC

## 2022-04-13 MED ORDER — TRUEPLUS 5-BEVEL PEN NEEDLES 32G X 4 MM MISC: 2 refills | Status: AC

## 2022-04-13 MED ORDER — LANTUS SOLOSTAR 100 UNIT/ML ~~LOC~~ SOPN: 16.0000 [IU] | PEN_INJECTOR | Freq: Every day | SUBCUTANEOUS | 1 refills | Status: AC

## 2022-04-13 MED ORDER — ATORVASTATIN CALCIUM 40 MG PO TABS: 40.0000 mg | ORAL_TABLET | Freq: Every day | ORAL | 1 refills | Status: AC

## 2022-04-13 MED ORDER — ACCU-CHEK SOFTCLIX LANCETS MISC: 3 refills | Status: AC

## 2022-04-13 NOTE — Progress Notes (Signed)
Virtual Visit via Telephone Note  I discussed the limitations, risks, security and privacy concerns of performing an evaluation and management service by telephone and the availability of in person appointments. I also discussed with the patient that there may be a patient responsible charge related to this service. The patient expressed understanding and agreed to proceed.    I connected with [name] on 04/13/22  at   8:30 AM EDT  EDT by telephone and verified that I am speaking with the correct person using two identifiers.  Location of Patient: Private Residence   Location of Provider: [place] and [place] participating in Telemedicine visit: Geryl Rankins FNP-BC [name]    History of Present Illness: Telemedicine visit for: medication refills Ms. Klutts is moving to [place] and will need to establish with a new PCP. She is requesting refills of her medications at this time.   DM 2 Diabetes is well controlled with metformin 1000 mg daily.  LDL not at goal and I will be increasing her atorvastatin to 40 mg today.  Notes average blood glucose readings at home 90s to 130s fasting and postprandial. Lab Results  Component Value Date   HGBA1C 5.7 (A) 12/04/2021    Lab Results  Component Value Date   LDLCALC 160 (H) 08/03/2021    Patient has been counseled on age-appropriate routine health concerns for screening and prevention. These are reviewed and up-to-date. Referrals have been placed accordingly. Immunizations are up-to-date or declined.   Mammogram: Up-to-date due August 11, 2022 Pap smear: Up-to-date due 09/16/2024 Eye exam: Up-to-date due July 06, 2022  Past Medical History:  Diagnosis Date   Back pain    Diabetes mellitus ([facility])    Glaucoma    Hyperlipidemia    Menorrhagia 12/17/2011   No pertinent past medical history     Past Surgical History:  Procedure Laterality Date   CESAREAN SECTION   1981, 1989   2x   MULTIPLE TOOTH EXTRACTIONS      Family History  Problem Relation Age of Onset   Cancer Mother        colon   Cancer Father    Kidney disease Sister    Hypertension Brother     Social History   Socioeconomic History   Marital status: Divorced    Spouse name: Not on file   Number of children: Not on file   Years of education: Not on file   Highest education level: Not on file  Occupational History   Not on file  Tobacco Use   Smoking status: Never   Smokeless tobacco: Never  Vaping Use   Vaping Use: Never used  Substance and Sexual Activity   Alcohol use: No   Drug use: No   Sexual activity: Never    Birth control/protection: Abstinence    Comment: last intercourse was 5 yrs ago  Other Topics Concern   Not on file  Social History Narrative   Not on file   Social Determinants of Health   Financial Resource Strain: Not on file  Food Insecurity: Not on file  Transportation Needs: Not on file  Physical Activity: Not on file  Stress: Not on file  Social Connections: Not on file     Observations/Objective: Awake, alert and oriented x 3   Review of Systems  Constitutional:  Negative for fever, malaise/fatigue and weight loss.  HENT: Negative.  Negative for nosebleeds.   Eyes: Negative.  Negative for blurred  vision, double vision and photophobia.  Respiratory: Negative.  Negative for cough and shortness of breath.   Cardiovascular: Negative.  Negative for chest pain, palpitations and leg swelling.  Gastrointestinal: Negative.  Negative for heartburn, nausea and vomiting.  Musculoskeletal: Negative.  Negative for myalgias.  Neurological: Negative.  Negative for dizziness, focal weakness, seizures and headaches.  Psychiatric/Behavioral: Negative.  Negative for suicidal ideas.     Assessment and Plan: Diagnoses and all orders for this visit:  Type 2 diabetes mellitus with hyperglycemia, with long-term current use of insulin (HCC) -     metFORMIN  (GLUCOPHAGE) 500 MG tablet; Take 2 tablets (1,000 mg total) by mouth daily with breakfast. -     insulin glargine (LANTUS SOLOSTAR) 100 UNIT/ML Solostar Pen; Inject 16 Units into the skin daily. -     Insulin Pen Needle (TRUEPLUS 5-BEVEL PEN NEEDLES) 32G X 4 MM MISC; Use to inject Basaglar once daily. E11.65 -     glucose blood (ACCU-CHEK GUIDE) test strip; Use as instructed. Check blood glucose by fingerstick twice per day. -     Accu-Chek Softclix Lancets lancets; Use as instructed. Inject into the skin twice daily  Hyperlipidemia, unspecified hyperlipidemia type -     atorvastatin (LIPITOR) 40 MG tablet; Take 1 tablet (40 mg total) by mouth daily. CHOLESTEROL MEDICATION     Follow Up Instructions Return in about 6 months (around 10/13/2022).     I discussed the assessment and treatment plan with the patient. The patient was provided an opportunity to ask questions and all were answered. The patient agreed with the plan and demonstrated an understanding of the instructions.   The patient was advised to call back or seek an in-person evaluation if the symptoms worsen or if the condition fails to improve as anticipated.  I provided 10 minutes of non-face-to-face time during this encounter including median intraservice time, reviewing previous notes, labs, imaging, medications and explaining diagnosis and management.  Gildardo Pounds, FNP-BC
